# Patient Record
Sex: Male | Born: 1975 | Hispanic: Yes | Marital: Married | Smoking: Never smoker
Health system: Southern US, Community
[De-identification: ages and names within clinical notes are randomized; demographics above are authoritative.]

## PROBLEM LIST (undated history)

## (undated) DIAGNOSIS — M1990 Unspecified osteoarthritis, unspecified site: Secondary | ICD-10-CM

## (undated) HISTORY — PX: NO PAST SURGERIES: SHX2092

## (undated) HISTORY — DX: Unspecified osteoarthritis, unspecified site: M19.90

---

## 2020-01-30 ENCOUNTER — Ambulatory Visit: Payer: Self-pay | Attending: Internal Medicine

## 2020-01-30 DIAGNOSIS — Z23 Encounter for immunization: Secondary | ICD-10-CM

## 2020-01-30 NOTE — Progress Notes (Signed)
   Covid-19 Vaccination Clinic  Name:  Alistar Mcenery    MRN: [id number] DOB: 12-30-1975  01/30/2020  Mr. Tyris Eliot was observed post Covid-19 immunization for 15 minutes without incident. He was provided with Vaccine Information Sheet and instruction to access the V-Safe system.   Mr. Keylin Podolsky was instructed to call 911 with any severe reactions post vaccine: Marland Kitchen Difficulty breathing  . Swelling of face and throat  . A fast heartbeat  . A bad rash all over body  . Dizziness and weakness   Immunizations Administered    Name Date Dose VIS Date Route   Pfizer COVID-19 Vaccine 01/30/2020  3:05 PM 0.3 mL 10/16/2019 Intramuscular   Manufacturer: [employer], [employer]   Lot: [id number]   NDC: [address]-6

## 2020-02-20 ENCOUNTER — Ambulatory Visit: Payer: Self-pay | Attending: Internal Medicine

## 2020-02-20 DIAGNOSIS — Z23 Encounter for immunization: Secondary | ICD-10-CM

## 2020-02-20 NOTE — Progress Notes (Signed)
   Covid-19 Vaccination Clinic  Name:  Edwin Gordon    MRN: [id number] DOB: 10/01/1976  02/20/2020  Mr. Edwin Gordon was observed post Covid-19 immunization for 15 minutes   without incident. He was provided with Vaccine Information Sheet and instruction to access the V-Safe system.   Mr. Edwin Gordon was instructed to call 911 with any severe reactions post vaccine: Marland Kitchen Difficulty breathing  . Swelling of face and throat  . A fast heartbeat  . A bad rash all over body  . Dizziness and weakness   Immunizations Administered    Name Date Dose VIS Date Route   Pfizer COVID-19 Vaccine 02/20/2020  3:12 PM 0.3 mL 10/16/2019 Intramuscular   Manufacturer: [employer], [employer]   Lot: [id number]   NDC: [address]-6

## 2021-11-09 LAB — COMPREHENSIVE METABOLIC PANEL
Albumin: 4.1 (ref 3.5–5.0)
Calcium: 8.7 (ref 8.7–10.7)
Globulin: 2.2

## 2021-11-09 LAB — LIPID PANEL
Cholesterol: 203 — AB (ref 0–200)
HDL: 48 (ref 35–70)
LDL Cholesterol: 35
Triglycerides: 601 — AB (ref 40–160)

## 2021-11-09 LAB — HEPATIC FUNCTION PANEL
Bilirubin, Direct: 0.25 (ref 0.01–0.4)
Bilirubin, Total: 0.7

## 2021-11-09 LAB — BASIC METABOLIC PANEL
BUN: 30 — AB (ref 4–21)
Chloride: 100 (ref 99–108)
Creatinine: 1 (ref 0.6–1.3)
Glucose: 96
Potassium: 3.9 mEq/L (ref 3.5–5.1)
Sodium: 140 (ref 137–147)

## 2021-11-09 LAB — CBC AND DIFFERENTIAL
HCT: 49 (ref 41–53)
Hemoglobin: 16.9 (ref 13.5–17.5)

## 2022-01-23 NOTE — Progress Notes (Signed)
? ?I,Elena D DeSanto,acting as a scribe for Edwin Sprout, FNP.,have documented all relevant documentation on the behalf of Edwin Sprout, FNP,as directed by  Edwin Sprout, FNP while in the presence of Edwin Sprout, FNP. ? ? ?New patient visit ? ? ?Patient: Edwin Gordon   DOB: 11-Jan-1976   46 y.o. Male  MRN: [id number] ?Visit Date: 01/24/2022 ? ?Today's healthcare provider: [name], FNP  ? ?I, Juluis Mire, CMA, have reviewed all documentation for this visit. The documentation on 01/24/22 for the exam, diagnosis, procedures, and orders are all accurate and complete. ? ? ?Subjective  ?  ?Edwin Gordon is a 46 y.o. male who presents today as a new patient to establish care. He presents with [place] interpreter. ? ?HPI  ?Patient has complaint of joint pain, including knees and elbows that has been present for about 1 year that is getting progressively worse.  He states he has been told he has fatty liver, and 'thick blood'. ?He brings with him today labs that were done in [place] on 11/09/21 ? ?Past Medical History:  ?Diagnosis Date  ? Arthritis   ? ?Past Surgical History:  ?Procedure Laterality Date  ? NO PAST SURGERIES    ? ?No family status information on file.  ? ?History reviewed. No pertinent family history. ?Social History  ? ?Socioeconomic History  ? Marital status: Married  ?  Spouse name: Not on file  ? Number of children: Not on file  ? Years of education: Not on file  ? Highest education level: Not on file  ?Occupational History  ? Not on file  ?Tobacco Use  ? Smoking status: Never  ? Smokeless tobacco: Never  ?Substance and Sexual Activity  ? Alcohol use: Yes  ?  Alcohol/week: 6.0 standard drinks  ?  Types: 6 Cans of beer per week  ? Drug use: Never  ? Sexual activity: Not on file  ?Other Topics Concern  ? Not on file  ?Social History Narrative  ? Not on file  ? ?Social Determinants of Health  ? ?Financial Resource Strain: Not on file  ?Food Insecurity: Not on file  ?Transportation  Needs: Not on file  ?Physical Activity: Not on file  ?Stress: Not on file  ?Social Connections: Not on file  ? ?No outpatient medications prior to visit.  ? ?No facility-administered medications prior to visit.  ? ?Not on File ? ?Immunization History  ?Administered Date(s) Administered  ? PFIZER(Purple Top)SARS-COV-2 Vaccination 01/30/2020, 02/20/2020  ? ? ?Health Maintenance  ?Topic Date Due  ? Hepatitis C Screening  Never done  ? TETANUS/TDAP  Never done  ? COVID-19 Vaccine (3 - Booster for Pfizer series) 04/16/2020  ? INFLUENZA VACCINE  02/02/2022 (Originally 06/05/2021)  ? HIV Screening  Completed  ? HPV VACCINES  Aged Out  ? ? ?Patient Care Team: ?Edwin Sprout, FNP as PCP - General (Family Medicine) ? ?Review of Systems  ?Constitutional: Negative.   ?HENT: Negative.    ?Eyes: Negative.   ?Respiratory: Negative.    ?Cardiovascular: Negative.   ?Gastrointestinal: Negative.   ?Endocrine: Negative.   ?Genitourinary:  Positive for flank pain and hematuria.  ?Musculoskeletal:  Positive for arthralgias and back pain.  ?Skin: Negative.   ?Allergic/Immunologic: Negative.   ?Neurological:  Positive for numbness.  ?Hematological: Negative.   ?Psychiatric/Behavioral:  Positive for sleep disturbance (stress related).   ? ? ? Objective  ?  ?BP (!) 131/103 (BP Location: Right Arm, Patient Position: Sitting, Cuff Size:  Normal)   Pulse 86   Temp 98.8 ?F (37.1 ?C) (Oral)   Wt 181 lb (82.1 kg)   SpO2 100%  ?Physical Exam ?Vitals and nursing note reviewed.  ?Constitutional:   ?   Appearance: Normal appearance. He is well-developed, well-groomed and overweight.  ?HENT:  ?   Head: Normocephalic and atraumatic.  ?Eyes:  ?   Pupils: Pupils are equal, round, and reactive to light.  ?Cardiovascular:  ?   Rate and Rhythm: Normal rate and regular rhythm.  ?   Pulses: Normal pulses.  ?   Heart sounds: Normal heart sounds.  ?Pulmonary:  ?   Effort: Pulmonary effort is normal.  ?   Breath sounds: Normal breath sounds.  ?Abdominal:  ?    General: Bowel sounds are normal. There is no distension.  ?   Palpations: Abdomen is soft. There is no mass.  ?   Tenderness: There is no abdominal tenderness. There is no right CVA tenderness, left CVA tenderness, guarding or rebound.  ?   Hernia: No hernia is present.  ?Musculoskeletal:     ?   General: Normal range of motion.  ?   Cervical back: Normal range of motion and neck supple. No rigidity or tenderness.  ?Lymphadenopathy:  ?   Cervical: No cervical adenopathy.  ?Skin: ?   General: Skin is warm and dry.  ?   Capillary Refill: Capillary refill takes less than 2 seconds.  ?Neurological:  ?   General: No focal deficit present.  ?   Mental Status: He is alert and oriented to person, place, and time. Mental status is at baseline.  ?Psychiatric:     ?   Mood and Affect: Mood normal.     ?   Behavior: Behavior normal.     ?   Thought Content: Thought content normal.     ?   Judgment: Judgment normal.  ? ? ?Depression Screen ? ?  01/24/2022  ?  9:37 AM  ?PHQ 2/9 Scores  ?PHQ - 2 Score 0  ?PHQ- 9 Score 0  ? ?Results for orders placed or performed in visit on 01/24/22  ?CBC and differential  ?Result Value Ref Range  ? Hemoglobin 16.9 13.5 - 17.5  ? HCT 49 41 - 53  ?Basic metabolic panel  ?Result Value Ref Range  ? Glucose 96   ? BUN 30 (A) 4 - 21  ? Creatinine 1.0 0.6 - 1.3  ? Potassium 3.9 3.5 - 5.1 mEq/L  ? Sodium 140 137 - 147  ? Chloride 100 99 - 108  ?Comprehensive metabolic panel  ?Result Value Ref Range  ? Globulin 2.2   ? Calcium 8.7 8.7 - 10.7  ? Albumin 4.1 3.5 - 5.0  ?Lipid panel  ?Result Value Ref Range  ? Triglycerides 601 (A) 40 - 160  ? Cholesterol 203 (A) 0 - 200  ? HDL 48 35 - 70  ? LDL Cholesterol 35   ?Hepatic function panel  ?Result Value Ref Range  ? Bilirubin, Direct 0.25 0.01 - 0.4  ? Bilirubin, Total 0.7   ? ? Assessment & Plan   ?  ? ?Problem List Items Addressed This Visit   ? ?  ? Digestive  ? Fatty liver  ?  Chronic, previous stable ?Continues to drink ETOH, sometimes in excess ?Denies  ETOH addiction ?Wife nags on ETOH use d/t fatty liver ?Will repeat LFT and Chem panel ?No hx of liver US ?No abdominal pain  ?Denies change in stool  color, frequency, or consistency  ?  ?  ? Relevant Orders  ? Hepatic function panel  ? Lipid panel  ?  ? Other  ? Arthralgia - Primary  ?  Generalized complaints ?Recommend we check kidney and liver function prior to use of NSAIDs ?Will check for RA as well ?Encourage use of ice, 20 mins on/20 mins off and stretching to assist ?Ensure proper hydration ?  ?  ? Relevant Orders  ? Sed Rate (ESR)  ? C-reactive protein  ? Rheumatoid Factor  ? Chronic bilateral thoracic back pain  ?  Associated with heavy lifting at work, works as an Clinical biochemist  ?Does not take any medication ?Denies imaging hx ?Recommend we check kidney and liver function prior to use of NSAIDs ?  ?  ? Elevated blood pressure reading without diagnosis of hypertension  ?  Patient reports chronic elevation in blood pressure ?Was told it was "stress" related; however, he doesn't feel "stressed" ?Recommend starting medication today with focus on salt intake in diet and 1 month RTC for follow up ?  ?  ? Relevant Medications  ? losartan-hydrochlorothiazide (HYZAAR) 50-12.5 MG tablet  ? Other Relevant Orders  ? Comprehensive metabolic panel  ? CBC with Differential/Platelet  ? Encounter for hepatitis C screening test for low risk patient  ?  Low risk screen ?Treatable, and curable. If left untreated Hep C can lead to cirrhosis and liver failure. ?Encourage routine testing; recommend testing if risk factors change. ? ?  ?  ? Relevant Orders  ? Hepatitis C Antibody  ? History of hematuria  ?  Hx of hematuria with associated flank pain ?Will get UA with micro today to establish baseline ?Low risk for STI given hx of 1 sexual partner ?  ?  ? Relevant Orders  ? Urinalysis, Routine w reflex microscopic  ? Right elbow pain  ?  Chronic, >1 year, worsening ?Relieved with "bone movement" exacerbated with what sounds to be  nerve pain as pt described a "shooting current up arm" ?Hx of elevated uric acid level seen on blood ?Area is not red, inflamed or warm ?Will send for imaging with referral to sports medicine  ?  ?  ? Relevant O

## 2022-01-24 ENCOUNTER — Ambulatory Visit (INDEPENDENT_AMBULATORY_CARE_PROVIDER_SITE_OTHER): Payer: 59 | Admitting: Family Medicine

## 2022-01-24 ENCOUNTER — Ambulatory Visit
Admission: RE | Admit: 2022-01-24 | Discharge: 2022-01-24 | Disposition: A | Discharge status: Home / Self Care | Payer: 59 | Source: Ambulatory Visit | Attending: Family Medicine | Admitting: Family Medicine

## 2022-01-24 ENCOUNTER — Other Ambulatory Visit: Payer: Self-pay

## 2022-01-24 ENCOUNTER — Ambulatory Visit
Admission: RE | Admit: 2022-01-24 | Discharge: 2022-01-24 | Disposition: A | Discharge status: Home / Self Care | Payer: 59 | Attending: Family Medicine | Admitting: Family Medicine

## 2022-01-24 ENCOUNTER — Encounter: Payer: Self-pay | Admitting: Family Medicine

## 2022-01-24 VITALS — BP 131/103 | HR 86 | Temp 98.8°F | Wt 181.0 lb

## 2022-01-24 DIAGNOSIS — Z1329 Encounter for screening for other suspected endocrine disorder: Secondary | ICD-10-CM

## 2022-01-24 DIAGNOSIS — Z5320 Procedure and treatment not carried out because of patient's decision for unspecified reasons: Secondary | ICD-10-CM | POA: Insufficient documentation

## 2022-01-24 DIAGNOSIS — Z87448 Personal history of other diseases of urinary system: Secondary | ICD-10-CM

## 2022-01-24 DIAGNOSIS — M25521 Pain in right elbow: Secondary | ICD-10-CM | POA: Insufficient documentation

## 2022-01-24 DIAGNOSIS — Z131 Encounter for screening for diabetes mellitus: Secondary | ICD-10-CM

## 2022-01-24 DIAGNOSIS — K760 Fatty (change of) liver, not elsewhere classified: Secondary | ICD-10-CM

## 2022-01-24 DIAGNOSIS — Z125 Encounter for screening for malignant neoplasm of prostate: Secondary | ICD-10-CM

## 2022-01-24 DIAGNOSIS — Z1159 Encounter for screening for other viral diseases: Secondary | ICD-10-CM

## 2022-01-24 DIAGNOSIS — M546 Pain in thoracic spine: Secondary | ICD-10-CM

## 2022-01-24 DIAGNOSIS — R030 Elevated blood-pressure reading, without diagnosis of hypertension: Secondary | ICD-10-CM

## 2022-01-24 DIAGNOSIS — G8929 Other chronic pain: Secondary | ICD-10-CM

## 2022-01-24 DIAGNOSIS — M2550 Pain in unspecified joint: Secondary | ICD-10-CM

## 2022-01-24 DIAGNOSIS — Z114 Encounter for screening for human immunodeficiency virus [HIV]: Secondary | ICD-10-CM

## 2022-01-24 MED ORDER — LOSARTAN POTASSIUM-HCTZ 50-12.5 MG PO TABS: 1.0000 | ORAL_TABLET | Freq: Every day | ORAL | 1 refills | Status: DC

## 2022-01-24 NOTE — Assessment & Plan Note (Signed)
No A1c seen on lab work ?Patient is OW ?Will get A1c today given associated elevation in BP and heritage ?Continue to recommend balanced, lower carb meals. Smaller meal size, adding snacks. Choosing water as drink of choice and increasing purposeful exercise. ? ?

## 2022-01-24 NOTE — Assessment & Plan Note (Signed)
Low risk screen ?Consented; encouraged to "know your status" ?Recommend repeat screen if risk factors change ? ?

## 2022-01-24 NOTE — Assessment & Plan Note (Signed)
Associated with heavy lifting at work, works as an Personnel officer  ?Does not take any medication ?Denies imaging hx ?Recommend we check kidney and liver function prior to use of NSAIDs ?

## 2022-01-24 NOTE — Assessment & Plan Note (Signed)
Patient reports chronic elevation in blood pressure ?Was told it was "stress" related; however, he doesn't feel "stressed" ?Recommend starting medication today with focus on salt intake in diet and 1 month RTC for follow up ?

## 2022-01-24 NOTE — Assessment & Plan Note (Signed)
Low risk screen ?Treatable, and curable. If left untreated Hep C can lead to cirrhosis and liver failure. ?Encourage routine testing; recommend testing if risk factors change. ? ?

## 2022-01-24 NOTE — Assessment & Plan Note (Signed)
Chronic, >1 year, worsening ?Relieved with "bone movement" exacerbated with what sounds to be nerve pain as pt described a "shooting current up arm" ?Hx of elevated uric acid level seen on blood ?Area is not red, inflamed or warm ?Will send for imaging with referral to sports medicine  ?

## 2022-01-24 NOTE — Assessment & Plan Note (Signed)
Will get PSA in place of DRE ?Remote hx of hematuria with associated flank pain ?Denies current concerns ?Reports hx of 1 sexual partner, his wife ?

## 2022-01-24 NOTE — Assessment & Plan Note (Signed)
Will get TSH and free T4 today ?Normal thyroid exam ?Associated with elevated BP and OW body habitus ?

## 2022-01-24 NOTE — Assessment & Plan Note (Signed)
Generalized complaints ?Recommend we check kidney and liver function prior to use of NSAIDs ?Will check for RA as well ?Encourage use of ice, 20 mins on/20 mins off and stretching to assist ?Ensure proper hydration ?

## 2022-01-24 NOTE — Assessment & Plan Note (Signed)
Chronic, previous stable ?Continues to drink ETOH, sometimes in excess ?Denies ETOH addiction ?Wife nags on ETOH use d/t fatty liver ?Will repeat LFT and Chem panel ?No hx of liver US ?No abdominal pain  ?Denies change in stool color, frequency, or consistency  ?

## 2022-01-24 NOTE — Assessment & Plan Note (Signed)
Hx of hematuria with associated flank pain ?Will get UA with micro today to establish baseline ?Low risk for STI given hx of 1 sexual partner ?

## 2022-01-25 LAB — CBC WITH DIFFERENTIAL/PLATELET
Basophils Absolute: 0 10*3/uL (ref 0.0–0.2)
Basos: 1 %
EOS (ABSOLUTE): 0.1 10*3/uL (ref 0.0–0.4)
Eos: 1 %
Hematocrit: 49.3 % (ref 37.5–51.0)
Hemoglobin: 16.6 g/dL (ref 13.0–17.7)
Immature Grans (Abs): 0 10*3/uL (ref 0.0–0.1)
Immature Granulocytes: 1 %
Lymphocytes Absolute: 1.5 10*3/uL (ref 0.7–3.1)
Lymphs: 21 %
MCH: 31.2 pg (ref 26.6–33.0)
MCHC: 33.7 g/dL (ref 31.5–35.7)
MCV: 93 fL (ref 79–97)
Monocytes Absolute: 0.4 10*3/uL (ref 0.1–0.9)
Monocytes: 6 %
Neutrophils Absolute: 5.2 10*3/uL (ref 1.4–7.0)
Neutrophils: 70 %
Platelets: 199 10*3/uL (ref 150–450)
RBC: 5.32 x10E6/uL (ref 4.14–5.80)
RDW: 12.6 % (ref 11.6–15.4)
WBC: 7.2 10*3/uL (ref 3.4–10.8)

## 2022-01-25 LAB — LIPID PANEL
Chol/HDL Ratio: 5.5 ratio — ABNORMAL HIGH (ref 0.0–5.0)
Cholesterol, Total: 193 mg/dL (ref 100–199)
HDL: 35 mg/dL — ABNORMAL LOW (ref >39)
LDL Chol Calc (NIH): 96 mg/dL (ref 0–99)
Triglycerides: 368 mg/dL — ABNORMAL HIGH (ref 0–149)
VLDL Cholesterol Cal: 62 mg/dL — ABNORMAL HIGH (ref 5–40)

## 2022-01-25 LAB — URINALYSIS, ROUTINE W REFLEX MICROSCOPIC
Bilirubin, UA: NEGATIVE
Glucose, UA: NEGATIVE
Ketones, UA: NEGATIVE
Leukocytes,UA: NEGATIVE
Nitrite, UA: NEGATIVE
Protein,UA: NEGATIVE
RBC, UA: NEGATIVE
Specific Gravity, UA: 1.016 (ref 1.005–1.030)
Urobilinogen, Ur: 0.2 mg/dL (ref 0.2–1.0)
pH, UA: 8 — ABNORMAL HIGH (ref 5.0–7.5)

## 2022-01-25 LAB — COMPREHENSIVE METABOLIC PANEL
ALT: 60 IU/L — ABNORMAL HIGH (ref 0–44)
AST: 37 IU/L (ref 0–40)
Albumin/Globulin Ratio: 2 (ref 1.2–2.2)
Albumin: 4.9 g/dL (ref 4.0–5.0)
Alkaline Phosphatase: 103 IU/L (ref 44–121)
BUN/Creatinine Ratio: 11 (ref 9–20)
BUN: 9 mg/dL (ref 6–24)
Bilirubin Total: 0.9 mg/dL (ref 0.0–1.2)
CO2: 28 mmol/L (ref 20–29)
Calcium: 9.6 mg/dL (ref 8.7–10.2)
Chloride: 100 mmol/L (ref 96–106)
Creatinine, Ser: 0.79 mg/dL (ref 0.76–1.27)
Globulin, Total: 2.4 g/dL (ref 1.5–4.5)
Glucose: 100 mg/dL — ABNORMAL HIGH (ref 70–99)
Potassium: 4 mmol/L (ref 3.5–5.2)
Sodium: 139 mmol/L (ref 134–144)
Total Protein: 7.3 g/dL (ref 6.0–8.5)
eGFR: 113 mL/min/{1.73_m2} (ref >59)

## 2022-01-25 LAB — HEPATIC FUNCTION PANEL: Bilirubin, Direct: 0.2 mg/dL (ref 0.00–0.40)

## 2022-01-25 LAB — HEMOGLOBIN A1C
Est. average glucose Bld gHb Est-mCnc: 108 mg/dL
Hgb A1c MFr Bld: 5.4 % (ref 4.8–5.6)

## 2022-01-25 LAB — TSH+FREE T4
Free T4: 1.41 ng/dL (ref 0.82–1.77)
TSH: 1.26 u[IU]/mL (ref 0.450–4.500)

## 2022-01-25 LAB — C-REACTIVE PROTEIN: CRP: 1 mg/L (ref 0–10)

## 2022-01-25 LAB — HEPATITIS C ANTIBODY: Hep C Virus Ab: NONREACTIVE

## 2022-01-25 LAB — RHEUMATOID FACTOR: Rheumatoid fact SerPl-aCnc: <10 IU/mL (ref <14.0)

## 2022-01-25 LAB — PSA: Prostate Specific Ag, Serum: 1.2 ng/mL (ref 0.0–4.0)

## 2022-01-25 LAB — SEDIMENTATION RATE: Sed Rate: 2 mm/hr (ref 0–15)

## 2022-01-25 LAB — HIV ANTIBODY (ROUTINE TESTING W REFLEX): HIV Screen 4th Generation wRfx: NONREACTIVE

## 2022-01-25 NOTE — Progress Notes (Signed)
? ? ?  Subjective:   ? ?CC: R elbow pain ? ?I, Christoper Fabian, LAT, ATC, am serving as scribe for Dr. Clementeen Graham. ? ?HPI: Pt is a 47 y/o male c/o R elbow pain x one year. Pt notes bilat elbows bother him, but the R is much works He locates his pain to the medial aspect of the R elbow. 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 ? ?Grip strength: yes- sometimes, w/ increased use ?Radiates: yes ?Numbness/tingling: yes- fingers and forearm ?R elbow swelling: no ?Aggravating factors: pushing off ?Treatments tried: none ? ?Diagnostic testing: R elbow XR- 01/24/22 ? ?Pertinent review of Systems: No fevers or chills ? ?Relevant historical information: Fatty liver disease.  Works in [employer] representative.  Right-hand-dominant. ? ? ?Objective:   ? ?Vitals:  ? 01/26/22 0846  ?BP: 118/86  ?Pulse: 100  ?SpO2: 98%  ? ?General: Well Developed, well nourished, and in no acute distress.  ? ?MSK: Right elbow: Normal-appearing ?Tender palpation at medial epicondyle. ?Normal elbow motion. ?Elbow strength is intact. ?Pain with resisted wrist flexion, hand grip, and wrist supination. ?No pain with wrist extension. ? ?Left elbow: Normal. ?Mildly tender palpation of medial epicondyle. ?Normal elbow motion. ?Elbow strength is intact. ?Mild pain with resisted wrist flexion. ? ?Pulses cap refill and sensation are intact distally bilateral upper extremities. ? ?Lab and Radiology Results ? ?Procedure: Real-time Ultrasound Guided Injection of right elbow medial epicondyle common flexor tendon origin ?Device: Philips Affiniti 50G ?Images permanently stored and available for review in PACS ?Verbal informed consent obtained.  Discussed risks and benefits of procedure. Warned about infection bleeding damage to structures skin hypopigmentation and fat atrophy among others. ?Patient expresses understanding and agreement ?Time-out conducted.   ?Noted no overlying erythema, induration, or other signs of local infection.   ?Skin prepped in a sterile  fashion.   ?Local anesthesia: Topical Ethyl chloride.   ?With sterile technique and under real time ultrasound guidance: 40 mg of Kenalog and 1 mL of lidocaine injected into the common flexor tendon origin. Fluid seen entering the epicondyle.   ?Completed without difficulty   ?Pain immediately resolved suggesting accurate placement of the medication.   ?Advised to call if fevers/chills, erythema, induration, drainage, or persistent bleeding.   ?Images permanently stored and available for review in the ultrasound unit.  ?Impression: Technically successful ultrasound guided injection. ? ? ? ? ? ? ?Impression and Recommendations:   ? ?Assessment and Plan: ?46 y.o. 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  His right elbow pain is quite bothersome and interferes with his ability to work in [employer] representative.  Plan for injection today and home exercise program taught in clinic today for bilateral elbows by ATC.  Also recommend Voltaren gel.  Recheck in 6 weeks.. ? ?PDMP not reviewed this encounter. ?Orders Placed This Encounter  ?Procedures  ? [place] LIMITED JOINT SPACE STRUCTURES UP RIGHT(NO LINKED CHARGES)  ?  Order Specific Question:   Reason for Exam (SYMPTOM  OR DIAGNOSIS REQUIRED)  ?  Answer:   right elbow pain  ?  Order Specific Question:   Preferred imaging location?  ?  Answer:   [facility]  ? ?No orders of the defined types were placed in this encounter. ? ? ?Discussed warning signs or symptoms. Please see discharge instructions. Patient expresses understanding. ? ? ?The above documentation has been reviewed and is accurate and complete Clementeen Graham, M.D. ? ?Spanish interpreter used for today's visit. ?

## 2022-01-26 ENCOUNTER — Ambulatory Visit: Payer: Self-pay

## 2022-01-26 ENCOUNTER — Other Ambulatory Visit: Payer: Self-pay | Admitting: Family Medicine

## 2022-01-26 ENCOUNTER — Ambulatory Visit: Payer: 59 | Admitting: Family Medicine

## 2022-01-26 ENCOUNTER — Other Ambulatory Visit: Payer: Self-pay

## 2022-01-26 ENCOUNTER — Telehealth: Payer: Self-pay

## 2022-01-26 VITALS — BP 118/86 | HR 100 | Wt 180.2 lb

## 2022-01-26 DIAGNOSIS — M7702 Medial epicondylitis, left elbow: Secondary | ICD-10-CM | POA: Diagnosis not present

## 2022-01-26 DIAGNOSIS — M7701 Medial epicondylitis, right elbow: Secondary | ICD-10-CM

## 2022-01-26 DIAGNOSIS — M25521 Pain in right elbow: Secondary | ICD-10-CM | POA: Diagnosis not present

## 2022-01-26 NOTE — Patient Instructions (Addendum)
Thank you for coming in today.  ? ?You received an injection today. Seek immediate medical attention if the joint becomes red, extremely painful, or is oozing fluid.  ? ?Please complete the exercises that the athletic trainer went over with you:  View at [email or web address]-exercise-[email or web address] using code: [email or web address] ? ?Please use Voltaren gel (Generic Diclofenac Gel) up to 4x daily for pain as needed.  This is available over-the-counter as both the name brand Voltaren gel and the generic diclofenac gel.  ? ?Recheck back in 6 weeks ?

## 2022-01-26 NOTE — Telephone Encounter (Signed)
Using [employer] [phone]. Pt given lab results per notes of Merita Norton, FNP on 01/25/22 and 01/26/22. Pt verbalized understanding. He says he will buy the Omega 3 OTC. He says he was told before that he has a fatty liver and his blood was thick, he wants to know if this will cause his liver enzymes to be elevated and says he doesn't drink alcohol excessive. Advised I will send this to the provider and someone will call back with her recommendation. ? ? ?Jacky Kindle, FNP  ?01/26/2022  8:07 AM EDT   ?  ?All joint labs, including uric acid are negative. This is reassuring.  ? Jacky Kindle, FNP  ?01/25/2022  9:20 AM EDT   ?  ?FYI- patient speaks Spanish. ?  ?Blood chemistry shows elevated liver enzyme, ALT. This is likely elevated from excess alcohol use.  ?  ?Cholesterol total is stable. ?  ?Triglycerides are much lower, at 368. This is still very high. I would recommend medication to assist- Omega 3 fish oil, I can Rx or this can be OTC. ?  ?HDL, good cholesterol is low. Recommend increase in diet of healthier fat choices- low fat meats, oils that are not solid at room temperature, nuts, seeds, fish- cod, halibut, salmon, and avocado. Exercise can also increase this number.  Supplemental omega 3's can be taken as well but are not as helpful as dietary/exercise changes. ?  ?Risk of heart attack/stroke is low.  ?  ?The 10-year ASCVD risk score (Arnett DK, et al., 2019) is: 2.5% ?  Values used to calculate the score: ?    Age: 46 years ?    Sex: Male ?    Is Non-Hispanic African American: No ?    Diabetic: No ?    Tobacco smoker: No ?    Systolic Blood Pressure: 131 mmHg ?    Is BP treated: No ?    HDL Cholesterol: 35 mg/dL ?    Total Cholesterol: 193 mg/dL ?  ?Urine normal. ?  ?Cell counts normal. ?  ?Thyroid normal. ?  ?PSA normal. ?  ?Liver normal. ?  ?Non reactive/negative Hep C/HIV. ?  ?No pre-diabetes. ?  ?I will follow back on labs for inflammation for joint pain. ?   ? ?

## 2022-01-29 NOTE — Progress Notes (Signed)
Patient's wife advised as below. 

## 2022-01-29 NOTE — Telephone Encounter (Signed)
Lmtcb okay for [facility] nurse triage to advise. KW 

## 2022-02-01 ENCOUNTER — Other Ambulatory Visit: Payer: Self-pay | Admitting: Family Medicine

## 2022-02-01 DIAGNOSIS — K760 Fatty (change of) liver, not elsewhere classified: Secondary | ICD-10-CM

## 2022-02-01 DIAGNOSIS — R7401 Elevation of levels of liver transaminase levels: Secondary | ICD-10-CM | POA: Insufficient documentation

## 2022-02-01 NOTE — Telephone Encounter (Signed)
Left detailed message advising as per Robynn Pane. [facility] please advise if patient calls back.  ?

## 2022-02-22 NOTE — Progress Notes (Signed)
?  ? ?I,Sulibeya S Dimas,acting as a scribe for [name], FNP.,have documented all relevant documentation on the behalf of [name], FNP,as directed by  [name], FNP while in the presence of [name], FNP. ? ? ?Established patient visit ? ? ?Patient: Edwin Gordon   DOB: Apr 20, 1976   46 y.o. Male  MRN: [id number] ?Visit Date: 02/23/2022 ? ?Today's healthcare provider: [name], FNP  ?Re Introduced to nurse practitioner role and practice setting.  All questions answered.  Discussed provider/patient relationship and expectations. ? ?Interpretor: Eddie present for entirety of exam ? ?Chief Complaint  ?Patient presents with  ? Follow-up  ? ?Subjective  ?  ?HPI  ?Follow up for elevated blood pressure ? ?The patient was last seen for this 1 months ago. ?Changes made at last visit include start losartan-HCTZ 50-12.35m tablet daily, focus on salt intake. ? ?He reports excellent compliance with treatment. ?He feels that condition is Improved. ?He is not having side effects.  ? ?BP Readings from Last 3 Encounters:  ?02/23/22 120/80  ?01/26/22 118/86  ?01/24/22 (!) 131/103  ? ? ? ?----------------------------------------------------------------------------------------- ? ? ?Medications: ?Outpatient Medications Prior to Visit  ?Medication Sig  ? DHA-EPA-Vitamin E (OMEGA-3 COMPLEX PO) Take by mouth.  ? losartan-hydrochlorothiazide (HYZAAR) 50-12.5 MG tablet Take 1 tablet by mouth daily.  ? ?No facility-administered medications prior to visit.  ? ? ?Review of Systems  ?Constitutional:  Negative for appetite change and fatigue.  ?Respiratory:  Negative for chest tightness and shortness of breath.   ?Cardiovascular:  Negative for chest pain, palpitations and leg swelling.  ? ?Last CBC ?Lab Results  ?Component Value Date  ? WBC 7.2 01/24/2022  ? HGB 16.6 01/24/2022  ? HCT 49.3 01/24/2022  ? MCV 93 01/24/2022  ? MCH 31.2 01/24/2022  ? RDW 12.6 01/24/2022  ? PLT 199 01/24/2022  ? ?Last metabolic  panel ?Lab Results  ?Component Value Date  ? GLUCOSE 100 (H) 01/24/2022  ? NA 139 01/24/2022  ? K 4.0 01/24/2022  ? CL 100 01/24/2022  ? CO2 28 01/24/2022  ? BUN 9 01/24/2022  ? CREATININE 0.79 01/24/2022  ? EGFR 113 01/24/2022  ? CALCIUM 9.6 01/24/2022  ? PROT 7.3 01/24/2022  ? ALBUMIN 4.9 01/24/2022  ? LABGLOB 2.4 01/24/2022  ? AGRATIO 2.0 01/24/2022  ? BILITOT 0.9 01/24/2022  ? ALKPHOS 103 01/24/2022  ? AST 37 01/24/2022  ? ALT 60 (H) 01/24/2022  ? ?Last lipids ?Lab Results  ?Component Value Date  ? CHOL 193 01/24/2022  ? HDL 35 (L) 01/24/2022  ? [place]96 01/24/2022  ? TRIG 368 (H) 01/24/2022  ? CHOLHDL 5.5 (H) 01/24/2022  ? ?Last hemoglobin A1c ?Lab Results  ?Component Value Date  ? HGBA1C 5.4 01/24/2022  ? ?  ?  Objective  ?  ?BP 120/80 (BP Location: Right Arm, Patient Position: Sitting, Cuff Size: Large)   Pulse 81   Temp 98.5 ?F (36.9 ?C) (Oral)   Resp 16   Ht 5' 8"  (1.727 m)   Wt 177 lb 8 oz (80.5 kg)   SpO2 99%   BMI 26.99 kg/m?  ?BP Readings from Last 3 Encounters:  ?02/23/22 120/80  ?01/26/22 118/86  ?01/24/22 (!) 131/103  ? ?Wt Readings from Last 3 Encounters:  ?02/23/22 177 lb 8 oz (80.5 kg)  ?01/26/22 180 lb 3.2 oz (81.7 kg)  ?01/24/22 181 lb (82.1 kg)  ? ?  ? ?Physical Exam ?Vitals and nursing note reviewed.  ?Constitutional:   ?  Appearance: Normal appearance. He is overweight.  ?HENT:  ?   Head: Normocephalic and atraumatic.  ?Eyes:  ?   Pupils: Pupils are equal, round, and reactive to light.  ?Cardiovascular:  ?   Rate and Rhythm: Normal rate and regular rhythm.  ?   Pulses: Normal pulses.  ?   Heart sounds: Normal heart sounds.  ?Pulmonary:  ?   Effort: Pulmonary effort is normal.  ?   Breath sounds: Normal breath sounds.  ?Musculoskeletal:     ?   General: Tenderness present. No swelling. Normal range of motion.  ?   Cervical back: Normal range of motion.  ?   Right lower leg: No edema.  ?   Left lower leg: No edema.  ?   Comments: Bilateral tenderness on shins s/p use of ladder at  work. Encouraged use of voltaren, taller/thicker socks or use of a ladder pad.  ?Skin: ?   General: Skin is warm and dry.  ?   Capillary Refill: Capillary refill takes less than 2 seconds.  ?Neurological:  ?   General: No focal deficit present.  ?   Mental Status: He is alert and oriented to person, place, and time. Mental status is at baseline.  ?Psychiatric:     ?   Mood and Affect: Mood normal.     ?   Behavior: Behavior normal.     ?   Thought Content: Thought content normal.     ?   Judgment: Judgment normal.  ?  ? ?No results found for any visits on 02/23/22. ? Assessment & Plan  ?  ? ?Problem List Items Addressed This Visit   ? ?  ? Cardiovascular and Mediastinum  ? Primary hypertension - Primary  ?  Chronic, stable; improvement seen since starting medication ?Denies CP ?Denies SOB/ DOE ?Denies low blood pressure/hypotension ?Denies vision changes ?No LE Edema noted on exam ?Continue medication, Hyzaar 50-12.5 mg QD ?Denies side effects ?RTC 4 months ?Seek emergent care if you develop chest pain or chest pressure ? ?  ?  ?  ? Digestive  ? NAFLD (nonalcoholic fatty liver disease)  ?  Previous liver enzymes elevated; is working on diet and exercise. ?Will seek out time of liver US with referral for previous order placed ?Continue to limit ETOH consumption as previously recommended ? ?  ?  ?  ? Other  ? Arthralgia  ?  Acute on chronic ?Was seen for Elbow pain at Sports medicine; has improved s/p injection; has not started Voltaren ?Complaint of shin pain, s/p leaning on ladder- site appears to be bruised; however, pt notes that it is a chronic discoloration from site where ladder hits his shins ?Recommend use of voltaren on BLE; taller socks/thicker socks; or use of ladder pain. ? ?  ?  ? Elevated cholesterol with elevated triglycerides  ?  Chronic, previously elevated ?Low ASCVD ?Doing well with 2 tablets of Omega3 purchased OTC ?Continue medication with lifestyle changes; recommend FLP in 4 months ?We  recommend diet low in saturated fat and regular exercise - 30 min at least 5 times per week ? ? ?  ?  ? ? ? ?Return in about 4 months (around 06/25/2022) for chonic disease management.  ?   ? ?I, [name], FNP, have reviewed all documentation for this visit. The documentation on 02/23/22 for the exam, diagnosis, procedures, and orders are all accurate and complete. ? ?[name], FNP  ?[place] ?[phone] (phone) ?[phone] (fax) ? ?  [facility]  ?

## 2022-02-23 ENCOUNTER — Encounter: Payer: Self-pay | Admitting: Family Medicine

## 2022-02-23 ENCOUNTER — Ambulatory Visit: Payer: 59 | Admitting: Family Medicine

## 2022-02-23 VITALS — BP 120/80 | HR 81 | Temp 98.5°F | Resp 16 | Ht 68.0 in | Wt 177.5 lb

## 2022-02-23 DIAGNOSIS — M2550 Pain in unspecified joint: Secondary | ICD-10-CM

## 2022-02-23 DIAGNOSIS — I10 Essential (primary) hypertension: Secondary | ICD-10-CM

## 2022-02-23 DIAGNOSIS — E782 Mixed hyperlipidemia: Secondary | ICD-10-CM

## 2022-02-23 DIAGNOSIS — K760 Fatty (change of) liver, not elsewhere classified: Secondary | ICD-10-CM | POA: Insufficient documentation

## 2022-02-23 NOTE — Assessment & Plan Note (Signed)
Chronic, stable; improvement seen since starting medication ?Denies CP ?Denies SOB/ DOE ?Denies low blood pressure/hypotension ?Denies vision changes ?No LE Edema noted on exam ?Continue medication, Hyzaar 50-12.5 mg QD ?Denies side effects ?RTC 4 months ?Seek emergent care if you develop chest pain or chest pressure ? ?

## 2022-02-23 NOTE — Assessment & Plan Note (Signed)
Chronic, previously elevated ?Low ASCVD ?Doing well with 2 tablets of Omega3 purchased OTC ?Continue medication with lifestyle changes; recommend FLP in 4 months ?We recommend diet low in saturated fat and regular exercise - 30 min at least 5 times per week ? ?

## 2022-02-23 NOTE — Assessment & Plan Note (Signed)
Previous liver enzymes elevated; is working on diet and exercise. ?Will seek out time of liver US with referral for previous order placed ?Continue to limit ETOH consumption as previously recommended ?

## 2022-02-23 NOTE — Assessment & Plan Note (Signed)
Acute on chronic ?Was seen for Elbow pain at Sports medicine; has improved s/p injection; has not started Voltaren ?Complaint of shin pain, s/p leaning on ladder- site appears to be bruised; however, pt notes that it is a chronic discoloration from site where ladder hits his shins ?Recommend use of voltaren on BLE; taller socks/thicker socks; or use of ladder pain. ?

## 2022-03-02 ENCOUNTER — Other Ambulatory Visit: Payer: Self-pay | Admitting: Family Medicine

## 2022-03-02 ENCOUNTER — Ambulatory Visit
Admission: RE | Admit: 2022-03-02 | Discharge: 2022-03-02 | Disposition: A | Discharge status: Home / Self Care | Payer: 59 | Source: Ambulatory Visit | Attending: Family Medicine | Admitting: Family Medicine

## 2022-03-02 DIAGNOSIS — R7401 Elevation of levels of liver transaminase levels: Secondary | ICD-10-CM | POA: Diagnosis present

## 2022-03-02 DIAGNOSIS — K760 Fatty (change of) liver, not elsewhere classified: Secondary | ICD-10-CM

## 2022-03-16 ENCOUNTER — Ambulatory Visit: Payer: 59 | Admitting: Family Medicine

## 2022-04-10 ENCOUNTER — Ambulatory Visit: Payer: Self-pay | Admitting: Internal Medicine

## 2022-04-13 LAB — SPECIMEN STATUS REPORT

## 2022-04-13 LAB — URIC ACID: Uric Acid: 5.8 mg/dL (ref 3.8–8.4)

## 2022-05-17 ENCOUNTER — Other Ambulatory Visit: Payer: Self-pay | Admitting: Family Medicine

## 2022-05-17 DIAGNOSIS — M2550 Pain in unspecified joint: Secondary | ICD-10-CM

## 2022-06-06 ENCOUNTER — Other Ambulatory Visit: Payer: Self-pay | Admitting: Family Medicine

## 2022-06-20 NOTE — Progress Notes (Signed)
Established patient visit   Patient: Edwin Gordon   DOB: 1976-06-23   46 y.o. Male  MRN: [id number] Visit Date: 06/22/2022  Today's healthcare provider: [name], FNP  Introduced to nurse practitioner role and practice setting.  All questions answered.  Discussed provider/patient relationship and expectations.  I,Tiffany J Bragg,acting as a scribe for [name], FNP.,have documented all relevant documentation on the behalf of [name], FNP,as directed by  [name], FNP while in the presence of [name], FNP.  Chief Complaint  Patient presents with   Hypertension   Hyperlipidemia   Subjective    HPI  Hypertension, follow-up  BP Readings from Last 3 Encounters:  06/22/22 (!) 134/97  02/23/22 120/80  01/26/22 118/86   Wt Readings from Last 3 Encounters:  06/22/22 184 lb (83.5 kg)  02/23/22 177 lb 8 oz (80.5 kg)  01/26/22 180 lb 3.2 oz (81.7 kg)     He was last seen for hypertension 4 months ago.  BP at that visit was 120/80. Management since that visit includes continue hyzaar.  He reports excellent compliance with treatment. He is not having side effects.  He is following a Regular diet. He is not exercising. He does not smoke.  Use of agents associated with hypertension: none.   Outside blood pressures are not checked regularly. Symptoms: No chest pain Yes chest pressure  No palpitations No syncope  No dyspnea No orthopnea  No paroxysmal nocturnal dyspnea No lower extremity edema   Pertinent labs Lab Results  Component Value Date   CHOL 193 01/24/2022   HDL 35 (L) 01/24/2022   LDLCALC 96 01/24/2022   TRIG 368 (H) 01/24/2022   CHOLHDL 5.5 (H) 01/24/2022   Lab Results  Component Value Date   NA 139 01/24/2022   K 4.0 01/24/2022   CREATININE 0.79 01/24/2022   EGFR 113 01/24/2022   GLUCOSE 100 (H) 01/24/2022   TSH 1.260 01/24/2022     The 10-year ASCVD risk score (Arnett DK, et al., 2019) is: 3.1%  Lipid/Cholesterol,  Follow-up  Last lipid panel Other pertinent labs  Lab Results  Component Value Date   CHOL 193 01/24/2022   HDL 35 (L) 01/24/2022   LDLCALC 96 01/24/2022   TRIG 368 (H) 01/24/2022   CHOLHDL 5.5 (H) 01/24/2022   Lab Results  Component Value Date   ALT 60 (H) 01/24/2022   AST 37 01/24/2022   PLT 199 01/24/2022   TSH 1.260 01/24/2022     He was last seen for this 4 months ago.  Management since that visit includes continue OTC omega supplement.  He reports excellent compliance with treatment. He is not having side effects.   Symptoms: No chest pain No chest pressure/discomfort  No dyspnea No lower extremity edema  Yes numbness or tingling of extremity No orthopnea  No palpitations No paroxysmal nocturnal dyspnea  No speech difficulty No syncope   Current diet: in general, a "healthy" diet   Current exercise: none  The 10-year ASCVD risk score (Arnett DK, et al., 2019) is: 3.1%  ---------------------------------------------------------------------------------------------------  ---------------------------------------------------------------------------------------------------   Medications: Outpatient Medications Prior to Visit  Medication Sig   DHA-EPA-Vitamin E (OMEGA-3 COMPLEX PO) Take by mouth.   [DISCONTINUED] losartan-hydrochlorothiazide (HYZAAR) 50-12.5 MG tablet Take 1 tablet by mouth daily.   No facility-administered medications prior to visit.   Review of Systems    Objective    BP (!) 134/97 (BP Location: Right Arm, Patient Position: Sitting, Cuff Size:  Normal)   Pulse 83   Temp 98.2 F (36.8 C) (Core)   Resp 16   Ht 5' 6"  (1.676 m)   Wt 184 lb (83.5 kg)   SpO2 100%   BMI 29.70 kg/m    Physical Exam Vitals and nursing note reviewed.  Constitutional:      Appearance: Normal appearance. He is overweight.  HENT:     Head: Normocephalic and atraumatic.  Eyes:     Pupils: Pupils are equal, round, and reactive to light.  Cardiovascular:      Rate and Rhythm: Normal rate and regular rhythm.     Pulses: Normal pulses.     Heart sounds: Normal heart sounds.  Pulmonary:     Effort: Pulmonary effort is normal.     Breath sounds: Normal breath sounds.  Musculoskeletal:        General: Normal range of motion.     Cervical back: Normal range of motion.  Skin:    General: Skin is warm and dry.     Capillary Refill: Capillary refill takes less than 2 seconds.  Neurological:     General: No focal deficit present.     Mental Status: He is alert and oriented to person, place, and time. Mental status is at baseline.  Psychiatric:        Mood and Affect: Mood normal.        Behavior: Behavior normal.        Thought Content: Thought content normal.        Judgment: Judgment normal.     No results found for any visits on 06/22/22.  Assessment & Plan     Problem List Items Addressed This Visit       Cardiovascular and Mediastinum   Primary hypertension - Primary    Chronic, previously stable; goal <140/<90 Did not take medication today as he came fasting; advised that able to use medications prior to appts with water for future reference Continue Hyzaar 50-12.5      Relevant Medications   losartan-hydrochlorothiazide (HYZAAR) 50-12.5 MG tablet   Other Relevant Orders   Comprehensive Metabolic Panel (CMET)     Other   Elevated cholesterol with elevated triglycerides    Chronic, previous unstable Has started on fish oils Repeat FLP I recommend diet low in saturated fat and regular exercise - 30 min at least 5 times per week       Relevant Medications   losartan-hydrochlorothiazide (HYZAAR) 50-12.5 MG tablet   Other Relevant Orders   Lipid panel   Return in about 6 months (around 12/23/2022) for annual examination.     Vonna Kotyk, FNP, have reviewed all documentation for this visit. The documentation on 06/22/22 for the exam, diagnosis, procedures, and orders are all accurate and complete.  [name], [place] [phone] (phone) [phone] (fax)  [place]

## 2022-06-22 ENCOUNTER — Encounter: Payer: Self-pay | Admitting: Family Medicine

## 2022-06-22 ENCOUNTER — Ambulatory Visit (INDEPENDENT_AMBULATORY_CARE_PROVIDER_SITE_OTHER): Payer: 59 | Admitting: Family Medicine

## 2022-06-22 VITALS — BP 134/97 | HR 83 | Temp 98.2°F | Resp 16 | Ht 66.0 in | Wt 184.0 lb

## 2022-06-22 DIAGNOSIS — E782 Mixed hyperlipidemia: Secondary | ICD-10-CM | POA: Diagnosis not present

## 2022-06-22 DIAGNOSIS — I10 Essential (primary) hypertension: Secondary | ICD-10-CM | POA: Diagnosis not present

## 2022-06-22 MED ORDER — LOSARTAN POTASSIUM-HCTZ 50-12.5 MG PO TABS: 1.0000 | ORAL_TABLET | Freq: Every day | ORAL | 1 refills | Status: DC

## 2022-06-22 NOTE — Assessment & Plan Note (Signed)
Chronic, previous unstable Has started on fish oils Repeat FLP I recommend diet low in saturated fat and regular exercise - 30 min at least 5 times per week

## 2022-06-22 NOTE — Assessment & Plan Note (Addendum)
Chronic, previously stable; goal <140/<90 Did not take medication today as he came fasting; advised that able to use medications prior to appts with water for future reference Continue Hyzaar 50-12.5

## 2022-06-23 LAB — COMPREHENSIVE METABOLIC PANEL
ALT: 78 IU/L — ABNORMAL HIGH (ref 0–44)
AST: 40 IU/L (ref 0–40)
Albumin/Globulin Ratio: 2 (ref 1.2–2.2)
Albumin: 4.9 g/dL (ref 4.1–5.1)
Alkaline Phosphatase: 93 IU/L (ref 44–121)
BUN/Creatinine Ratio: 11 (ref 9–20)
BUN: 9 mg/dL (ref 6–24)
Bilirubin Total: 0.8 mg/dL (ref 0.0–1.2)
CO2: 21 mmol/L (ref 20–29)
Calcium: 9.3 mg/dL (ref 8.7–10.2)
Chloride: 102 mmol/L (ref 96–106)
Creatinine, Ser: 0.82 mg/dL (ref 0.76–1.27)
Globulin, Total: 2.4 g/dL (ref 1.5–4.5)
Glucose: 111 mg/dL — ABNORMAL HIGH (ref 70–99)
Potassium: 4.5 mmol/L (ref 3.5–5.2)
Sodium: 141 mmol/L (ref 134–144)
Total Protein: 7.3 g/dL (ref 6.0–8.5)
eGFR: 112 mL/min/{1.73_m2} (ref >59)

## 2022-06-23 LAB — LIPID PANEL
Chol/HDL Ratio: 5.9 ratio — ABNORMAL HIGH (ref 0.0–5.0)
Cholesterol, Total: 208 mg/dL — ABNORMAL HIGH (ref 100–199)
HDL: 35 mg/dL — ABNORMAL LOW (ref >39)
LDL Chol Calc (NIH): 128 mg/dL — ABNORMAL HIGH (ref 0–99)
Triglycerides: 253 mg/dL — ABNORMAL HIGH (ref 0–149)
VLDL Cholesterol Cal: 45 mg/dL — ABNORMAL HIGH (ref 5–40)

## 2022-06-25 ENCOUNTER — Other Ambulatory Visit: Payer: Self-pay | Admitting: Family Medicine

## 2022-06-25 NOTE — Progress Notes (Signed)
Cholesterol is slightly increased as is bad/LDL cholesterol. HDL/good cholesterol remains stable. Fats/triglycerides have improved.  Stable elevation in glucose and liver enzyme/ALT.   Jacky Kindle, FNP  [facility] [address] [place], [place] [address] [phone] (phone) [phone] (fax) [facility]

## 2022-06-29 ENCOUNTER — Ambulatory Visit: Payer: Self-pay | Admitting: *Deleted

## 2022-06-29 NOTE — Telephone Encounter (Signed)
Summary: sore arm   Pt states he had labs done 2 wks ago and feels his tendon or nerve was damaged during the process   Pt states his arm is sore and feels "weird"   Please fu w/ pt   Pt requires a spanish interpreter      Called patient via interpreter Jerene Dilling [phone] to review arm pain. No answer, LVMTCB

## 2022-06-29 NOTE — Telephone Encounter (Signed)
  Chief Complaint: Pain when lifting with right arm Symptoms: Pain when lifting heavy things with right arm Frequency: since blood draw 2 weeks ago Pertinent Negatives: Patient denies redness Disposition: [] ED /[] Urgent Care (no appt availability in office) / [x] Appointment(In office/virtual)/ []  [place] [facility]/ [] Home Care/ [] Refused Recommended Disposition /[] [place] [facility]/ []  Follow-up with PCP Additional Notes: Pt had blood drawn 2 weeks ago and states that during blood draw the needle went into his tendon or something. He states that since then his arm feels strange and it is painful when he lifts heavy thing with his right arm.     Summary: sore arm   Pt states he had labs done 2 wks ago and feels his tendon or nerve was damaged during the process   Pt states his arm is sore and feels "weird"   Please fu w/ pt   Pt requires a spanish interpreter      Reason for Disposition  [1] After 2 weeks AND [2] still painful  Answer Assessment - Initial Assessment Questions 1. MECHANISM: "How did the injury happen?"     Blood Draw 2. ONSET: "When did the injury happen?" (Minutes or hours ago)      2 weeks ago 3. LOCATION: "Where is the injury located?" "Which arm?"     Right arm 4. APPEARANCE of INJURY: "What does the injury look like?"      normal 5. SEVERITY: "Can you use the arm normally?"      No - cannot lift heavy things. Feels pulling in his forearm 6. SWELLING or BRUISING: "is there any swelling or bruising?" If Yes, ask: "How large is it? (e.g., inches, centimeters)      no 7. PAIN: "Is there pain?" If Yes, ask: "How bad is the pain?"    (Scale 1-10; or mild, moderate, severe)   - NONE (0): No pain.   - MILD (1-3): Doesn't interfere with normal activities.   - MODERATE (4-7): Interferes with normal activities (e.g., work or school) or awakens from sleep.   - SEVERE (8-10): Excruciating pain, unable to do any normal activities, unable to hold a cup of  water.     8-9/10.   8. TETANUS: For any breaks in the skin, ask: "When was the last tetanus booster?"     na 9. OTHER SYMPTOMS: "Do you have any other symptoms?"  (e.g., numbness in hand)     no 10. PREGNANCY: "Is there any chance you are pregnant?" "When was your last menstrual period?"       na  Protocols used: Arm Injury-A-AH

## 2022-06-29 NOTE — Telephone Encounter (Signed)
Please review and advise.

## 2022-07-02 ENCOUNTER — Encounter: Payer: Self-pay | Admitting: Family Medicine

## 2022-07-02 ENCOUNTER — Ambulatory Visit (INDEPENDENT_AMBULATORY_CARE_PROVIDER_SITE_OTHER): Payer: 59 | Admitting: Family Medicine

## 2022-07-02 VITALS — BP 119/80 | HR 100 | Temp 98.7°F | Resp 14 | Wt 185.0 lb

## 2022-07-02 DIAGNOSIS — M7701 Medial epicondylitis, right elbow: Secondary | ICD-10-CM | POA: Diagnosis not present

## 2022-07-02 MED ORDER — NAPROXEN 500 MG PO TABS: 500.0000 mg | ORAL_TABLET | Freq: Two times a day (BID) | ORAL | 0 refills | Status: DC

## 2022-07-02 NOTE — Progress Notes (Signed)
     I,Roshena L Chambers,acting as a scribe for Mila Merry, MD.,have documented all relevant documentation on the behalf of Mila Merry, MD,as directed by  Mila Merry, MD while in the presence of Mila Merry, MD.   Established patient visit   Patient: Edwin Gordon   DOB: 1976/02/01   46 y.o. Male  MRN: [id number] Visit Date: 07/02/2022  Today's healthcare provider: Mila Merry, MD   Chief Complaint  Patient presents with   Arm Pain   Subjective    Arm Pain  Incident onset: 2 weeks ago after having blood drawn. Pain location: right arm. The quality of the pain is described as burning (also a pulling pain in the veins of his right forearm). The symptoms are aggravated by lifting (lifting a cup of water).  He does have history of medial epicondylitis receiving steroid injection by Dr. Denyse Amass in March. He is having some pain around the medial epicondyle, but he states he is also having sensation of "pinch" along of right antecubital fossae ever since having blood drawn 10 days ago. He has been applying ice and some type of ointment which has not been helpful.   Medications: Outpatient Medications Prior to Visit  Medication Sig   DHA-EPA-Vitamin E (OMEGA-3 COMPLEX PO) Take by mouth.   losartan-hydrochlorothiazide (HYZAAR) 50-12.5 MG tablet Take 1 tablet by mouth daily.   No facility-administered medications prior to visit.       Objective    BP 119/80 (BP Location: Left Arm, Patient Position: Sitting, Cuff Size: Large)   Pulse 100   Temp 98.7 F (37.1 C) (Oral)   Resp 14   Wt 185 lb (83.9 kg)   BMI 29.86 kg/m   Physical Exam   Mild tenderness of right medial epicondyle, no other tenderness redness or gross deformities. Normal strength of right hand and elbow.   Assessment & Plan     1. Medial epicondylitis of right elbow Naprosyn 500mg  twice a day as needed. Expect sensation of 'pinch' around site of blood draw to resolve after a 1-2 more weeks.    Spanish interpreter service were available by telephone throughout duration of today's visit.      The entirety of the information documented in the History of Present Illness, Review of Systems and Physical Exam were personally obtained by me. Portions of this information were initially documented by the CMA and reviewed by me for thoroughness and accuracy.     , MD  [facility] [phone] (phone) [phone] (fax)  [facility]

## 2022-07-14 ENCOUNTER — Other Ambulatory Visit: Payer: Self-pay | Admitting: Family Medicine

## 2022-07-14 DIAGNOSIS — M7701 Medial epicondylitis, right elbow: Secondary | ICD-10-CM

## 2022-08-01 ENCOUNTER — Ambulatory Visit: Payer: 59 | Admitting: Family Medicine

## 2022-11-30 ENCOUNTER — Telehealth: Payer: Self-pay | Admitting: Family Medicine

## 2022-12-24 ENCOUNTER — Ambulatory Visit: Payer: 59 | Admitting: Family Medicine

## 2022-12-25 ENCOUNTER — Ambulatory Visit: Payer: 59 | Admitting: Family Medicine

## 2023-01-06 ENCOUNTER — Other Ambulatory Visit: Payer: Self-pay | Admitting: Family Medicine

## 2023-01-06 DIAGNOSIS — I10 Essential (primary) hypertension: Secondary | ICD-10-CM

## 2023-01-07 NOTE — Telephone Encounter (Signed)
Requested Prescriptions  Pending Prescriptions Disp Refills   losartan-hydrochlorothiazide (HYZAAR) 50-12.5 MG tablet [Pharmacy Med Name: LOSARTAN/HCTZ 50/12.'5MG'$  TABLETS] 90 tablet 0    Sig: TAKE 1 TABLET BY MOUTH DAILY     Cardiovascular: ARB + Diuretic Combos Failed - 01/06/2023  8:35 AM      Failed - K in normal range and within 180 days    Potassium  Date Value Ref Range Status  06/22/2022 4.5 3.5 - 5.2 mmol/L Final         Failed - Na in normal range and within 180 days    Sodium  Date Value Ref Range Status  06/22/2022 141 134 - 144 mmol/L Final         Failed - Cr in normal range and within 180 days    Creatinine, Ser  Date Value Ref Range Status  06/22/2022 0.82 0.76 - 1.27 mg/dL Final         Failed - eGFR is 10 or above and within 180 days    eGFR  Date Value Ref Range Status  06/22/2022 112 >59 mL/min/1.73 Final         Failed - Valid encounter within last 6 months    Recent Outpatient Visits           6 months ago Medial epicondylitis of right elbow   [place], Donald E, MD   6 months ago Primary hypertension   [place] [name], FNP   10 months ago Primary hypertension   [place] Tally Joe T, FNP   11 months ago Arthralgia, unspecified joint   [facility] [name], [place]              Passed - Patient is not pregnant      Passed - Last BP in normal range    BP Readings from Last 1 Encounters:  07/02/22 119/80

## 2023-02-21 ENCOUNTER — Ambulatory Visit: Payer: 59 | Admitting: Family Medicine

## 2023-02-25 ENCOUNTER — Ambulatory Visit: Payer: 59 | Admitting: Family Medicine

## 2023-02-28 NOTE — Progress Notes (Signed)
I,J'ya E Hunter,acting as a scribe for Jacky Kindle, FNP.,have documented all relevant documentation on the behalf of Jacky Kindle, FNP,as directed by  Jacky Kindle, FNP while in the presence of Jacky Kindle, FNP.  Established patient visit  Patient: Edwin Gordon   DOB: 1976/07/08   47 y.o. Male  MRN: [id number] Visit Date: 03/01/2023  Today's healthcare provider: Jacky Kindle, FNP  Re Introduced to nurse practitioner role and practice setting.  All questions answered.  Discussed provider/patient relationship and expectations.  Onalee Hua [id number] interpretor   Chief Complaint  Patient presents with   Elbow Pain   Subjective    HPI  Elbow Pain: Patient complains of right elbow pain. Onset of the symptoms was about a month ago. Inciting event:  patient reports that when he got blood work during his last visit he felt a sharp point and his elbow. He was hoping that the pain would go away and it never did . Current symptoms include locking, point tenderness , stiffness , and difficulty picking up items . Pain is aggravated by: grasping, lifting heavy objects, supination/pronation as when opening doors. Patient's overall course: gradually worsening. Patient has had no prior elbow problems. Evaluation to date: none. Treatment to date: ice.  Pain level: 9/10  Back Pain: Patient presents for presents evaluation of low back problems.  Symptoms have been present for 1 week and include stiffness in right side of back . Initial inciting event: none. Symptoms are worst: evening. Alleviating factors identifiable by patient are bending backwards, bending sideways, and standing. Exacerbating factors identifiable by patient are recumbency. Treatments so far initiated by patient: none Previous lower back problems: none. Previous workup: none. Previous treatments: none.  He complains that the pain leaves him incapacitated occasionally.  Pain level: 10/10  Patient complains of discomfort in his right  ear. He says he tried to dislodge the particle with a cotton swab. He also complains of severe coughing associated with allergies.   Medications: Outpatient Medications Prior to Visit  Medication Sig   DHA-EPA-Vitamin E (OMEGA-3 COMPLEX PO) Take by mouth.   naproxen (NAPROSYN) 500 MG tablet TAKE 1 TABLET(500 MG) BY MOUTH TWICE DAILY WITH A MEAL   [DISCONTINUED] losartan-hydrochlorothiazide (HYZAAR) 50-12.5 MG tablet TAKE 1 TABLET BY MOUTH DAILY   No facility-administered medications prior to visit.    Review of Systems    Objective    BP 128/86 (BP Location: Left Arm, Patient Position: Sitting, Cuff Size: Normal)   Pulse 82   Temp 98.2 F (36.8 C) (Oral)   Resp 16   Ht 5' 7.32" (1.71 m)   Wt 176 lb 1.6 oz (79.9 kg)   SpO2 99%   BMI 27.32 kg/m   Physical Exam Vitals and nursing note reviewed.  Constitutional:      General: He is awake. He is not in acute distress.    Appearance: Normal appearance. He is well-developed and well-groomed. He is not ill-appearing, toxic-appearing or diaphoretic.  HENT:     Head: Normocephalic and atraumatic.     Jaw: There is normal jaw occlusion. No trismus, tenderness, swelling or pain on movement.     Salivary Glands: Right salivary gland is not diffusely enlarged or tender. Left salivary gland is not diffusely enlarged or tender.     Right Ear: Hearing, tympanic membrane, ear canal and external ear normal. There is no impacted cerumen.     Left Ear: Hearing, tympanic membrane, ear canal and external ear normal.  There is no impacted cerumen.     Ears:     Comments: 2 long hairs noted in r ear    Nose: Nose normal. No congestion or rhinorrhea.     Right Turbinates: Not enlarged, swollen or pale.     Left Turbinates: Not enlarged, swollen or pale.     Right Sinus: No maxillary sinus tenderness or frontal sinus tenderness.     Left Sinus: No maxillary sinus tenderness or frontal sinus tenderness.     Mouth/Throat:     Lips: Pink.     Mouth:  Mucous membranes are moist. No injury, lacerations, oral lesions or angioedema.     Pharynx: Oropharynx is clear. Uvula midline. No pharyngeal swelling, oropharyngeal exudate or posterior oropharyngeal erythema.     Tonsils: No tonsillar exudate or tonsillar abscesses.  Eyes:     General: Lids are normal. Vision grossly intact. Gaze aligned appropriately.        Right eye: No discharge.        Left eye: No discharge.     Extraocular Movements: Extraocular movements intact.     Conjunctiva/sclera: Conjunctivae normal.     Pupils: Pupils are equal, round, and reactive to light.  Neck:     Thyroid: No thyroid mass, thyromegaly or thyroid tenderness.     Vascular: No carotid bruit.     Trachea: Trachea normal. No tracheal tenderness.  Cardiovascular:     Rate and Rhythm: Normal rate and regular rhythm.     Pulses: Normal pulses.          Carotid pulses are 2+ on the right side and 2+ on the left side.      Radial pulses are 2+ on the right side and 2+ on the left side.       Femoral pulses are 2+ on the right side and 2+ on the left side.      Popliteal pulses are 2+ on the right side and 2+ on the left side.       Dorsalis pedis pulses are 2+ on the right side and 2+ on the left side.       Posterior tibial pulses are 2+ on the right side and 2+ on the left side.     Heart sounds: Normal heart sounds, S1 normal and S2 normal. No murmur heard.    No friction rub. No gallop.  Pulmonary:     Effort: Pulmonary effort is normal. No respiratory distress.     Breath sounds: Normal breath sounds and air entry. No stridor. No wheezing, rhonchi or rales.  Chest:     Chest wall: No tenderness.  Abdominal:     General: Abdomen is flat. Bowel sounds are normal. There is no distension.     Palpations: Abdomen is soft. There is no mass.     Tenderness: There is no abdominal tenderness. There is no guarding or rebound.     Hernia: No hernia is present.  Genitourinary:    Comments: Exam deferred;  denies complaints Musculoskeletal:        General: Tenderness present. No swelling, deformity or signs of injury. Normal range of motion.       Arms:     Cervical back: Normal range of motion and neck supple. No rigidity or tenderness.     Right lower leg: No edema.     Left lower leg: No edema.     Comments: R elbow tenderness; negative Phalen/tinel  Mid low back tenderness  Lymphadenopathy:  Cervical: No cervical adenopathy.     Right cervical: No superficial, deep or posterior cervical adenopathy.    Left cervical: No superficial, deep or posterior cervical adenopathy.  Skin:    General: Skin is warm and dry.     Capillary Refill: Capillary refill takes less than 2 seconds.     Coloration: Skin is not jaundiced or pale.     Findings: No bruising, erythema, lesion or rash.  Neurological:     General: No focal deficit present.     Mental Status: He is alert and oriented to person, place, and time. Mental status is at baseline.     GCS: GCS eye subscore is 4. GCS verbal subscore is 5. GCS motor subscore is 6.     Sensory: Sensation is intact. No sensory deficit.     Motor: Motor function is intact. No weakness.     Coordination: Coordination is intact.     Gait: Gait is intact.  Psychiatric:        Attention and Perception: Attention and perception normal.        Mood and Affect: Mood and affect normal.        Speech: Speech normal.        Behavior: Behavior normal. Behavior is cooperative.        Thought Content: Thought content normal.        Cognition and Memory: Cognition normal.        Judgment: Judgment normal.     No results found for any visits on 03/01/23.  Assessment & Plan     Problem List Items Addressed This Visit       Cardiovascular and Mediastinum   Primary hypertension    Chronic, well controlled At goal with known pre-diabetes Repeat labs Cbc and cmp Remains on hyzaar 50-12.5 mg daily       Relevant Medications   losartan-hydrochlorothiazide  (HYZAAR) 50-12.5 MG tablet     Digestive   NAFLD (nonalcoholic fatty liver disease)    Chronic, stable Previously recommend lower fat diet, exercise and substance use changes Patient request for additional CMP today to monitor enzymes Denies concerns       Relevant Orders   Comp. 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, waxes/wanes Denies mechanism of injury Has tried heat; however, has not tried ice Previously received injections by sports med- will refer back for further assessment Recommend use of PRN muscle relaxants to assist with possible ulnar nerve entrapment as well as steroid to assist with inflammation RTC as needed       Relevant Orders   Ambulatory referral to Sports Medicine     Other   Chronic bilateral thoracic back pain    Chronic, stable Denies concern for loss of bowel or bladder or concerns for distal pulse changes or paraesthesias Continue to monitor following use of steroids and prn muscle relaxants  Encourage healthy BMI      Relevant Medications   methocarbamol (ROBAXIN-750) 750 MG tablet   methylPREDNISolone (MEDROL DOSEPAK) 4 MG TBPK tablet   Elevated cholesterol with elevated triglycerides   Relevant Medications   losartan-hydrochlorothiazide (HYZAAR)  50-12.5 MG tablet   Other Relevant Orders   Lipid panel   Elevated glucose    Recommend A1c Continue to recommend balanced, lower carb meals. Smaller meal size, adding snacks. Choosing water as drink of choice and increasing purposeful exercise.       Relevant Orders   Hemoglobin A1c   Non-English speaking patient    Onalee Hua [id number]      Right ear pain    Foreign body noted; patient  tolerated procedure well      Screening PSA (prostate specific antigen)    Denies LUTS; recommend PSA in place of DRE. If PSA is elevated for age, we will repeat; if PSA remains elevated pt will be referred to urology for DRE and next steps for best treatment.        Relevant Orders   PSA   Spanish language interpreter needed    Onalee Hua [phone]      Return if symptoms worsen or fail to improve, for annual examination- schedule as needed .     Leilani Merl, FNP, have reviewed all documentation for this visit. The documentation on 03/01/23 for the exam, diagnosis, procedures, and orders are all accurate and complete.  Jacky Kindle, FNP  [facility] [phone] (phone) [phone] (fax)  [facility]

## 2023-03-01 ENCOUNTER — Ambulatory Visit: Payer: 59 | Admitting: Family Medicine

## 2023-03-01 ENCOUNTER — Other Ambulatory Visit: Payer: Self-pay

## 2023-03-01 ENCOUNTER — Encounter: Payer: Self-pay | Admitting: Family Medicine

## 2023-03-01 ENCOUNTER — Ambulatory Visit (INDEPENDENT_AMBULATORY_CARE_PROVIDER_SITE_OTHER): Payer: Self-pay | Admitting: Family Medicine

## 2023-03-01 VITALS — BP 128/86 | HR 82 | Temp 98.2°F | Resp 16 | Ht 67.32 in | Wt 176.1 lb

## 2023-03-01 DIAGNOSIS — K760 Fatty (change of) liver, not elsewhere classified: Secondary | ICD-10-CM

## 2023-03-01 DIAGNOSIS — T161XXA Foreign body in right ear, initial encounter: Secondary | ICD-10-CM

## 2023-03-01 DIAGNOSIS — I10 Essential (primary) hypertension: Secondary | ICD-10-CM

## 2023-03-01 DIAGNOSIS — Z789 Other specified health status: Secondary | ICD-10-CM

## 2023-03-01 DIAGNOSIS — Z125 Encounter for screening for malignant neoplasm of prostate: Secondary | ICD-10-CM | POA: Insufficient documentation

## 2023-03-01 DIAGNOSIS — Z758 Other problems related to medical facilities and other health care: Secondary | ICD-10-CM | POA: Insufficient documentation

## 2023-03-01 DIAGNOSIS — M7701 Medial epicondylitis, right elbow: Secondary | ICD-10-CM | POA: Insufficient documentation

## 2023-03-01 DIAGNOSIS — M546 Pain in thoracic spine: Secondary | ICD-10-CM

## 2023-03-01 DIAGNOSIS — H9201 Otalgia, right ear: Secondary | ICD-10-CM | POA: Insufficient documentation

## 2023-03-01 DIAGNOSIS — G8929 Other chronic pain: Secondary | ICD-10-CM

## 2023-03-01 DIAGNOSIS — E782 Mixed hyperlipidemia: Secondary | ICD-10-CM

## 2023-03-01 DIAGNOSIS — R7309 Other abnormal glucose: Secondary | ICD-10-CM | POA: Insufficient documentation

## 2023-03-01 MED ORDER — METHOCARBAMOL 750 MG PO TABS: 750.0000 mg | ORAL_TABLET | Freq: Two times a day (BID) | ORAL | 0 refills | Status: AC | PRN

## 2023-03-01 MED ORDER — LOSARTAN POTASSIUM-HCTZ 50-12.5 MG PO TABS
1.0000 | ORAL_TABLET | Freq: Every day | ORAL | 3 refills | Status: AC
Start: 2023-03-01 — End: ?

## 2023-03-01 MED ORDER — METHYLPREDNISOLONE 4 MG PO TBPK: ORAL_TABLET | ORAL | 0 refills | Status: AC

## 2023-03-01 NOTE — Assessment & Plan Note (Signed)
Recommend A1c Continue to recommend balanced, lower carb meals. Smaller meal size, adding snacks. Choosing water as drink of choice and increasing purposeful exercise.  

## 2023-03-01 NOTE — Assessment & Plan Note (Signed)
Chronic, stable Denies concern for loss of bowel or bladder or concerns for distal pulse changes or paraesthesias Continue to monitor following use of steroids and prn muscle relaxants  Encourage healthy BMI

## 2023-03-01 NOTE — Assessment & Plan Note (Signed)
Edwin Gordon [id number]

## 2023-03-01 NOTE — Assessment & Plan Note (Signed)
Chronic, waxes/wanes Denies mechanism of injury Has tried heat; however, has not tried ice Previously received injections by sports med- will refer back for further assessment Recommend use of PRN muscle relaxants to assist with possible ulnar nerve entrapment as well as steroid to assist with inflammation RTC as needed

## 2023-03-01 NOTE — Assessment & Plan Note (Signed)
Chronic, stable Previously recommend lower fat diet, exercise and substance use changes Patient request for additional CMP today to monitor enzymes Denies concerns

## 2023-03-01 NOTE — Assessment & Plan Note (Signed)
Denies LUTS; recommend PSA in place of DRE. If PSA is elevated for age, we will repeat; if PSA remains elevated pt will be referred to urology for DRE and next steps for best treatment.  

## 2023-03-01 NOTE — Assessment & Plan Note (Signed)
Edwin Gordon [id number] 

## 2023-03-01 NOTE — Assessment & Plan Note (Signed)
Chronic, well controlled At goal with known pre-diabetes Repeat labs Cbc and cmp Remains on hyzaar 50-12.5 mg daily

## 2023-03-01 NOTE — Assessment & Plan Note (Signed)
Foreign body noted; patient tolerated procedure well

## 2023-03-01 NOTE — Assessment & Plan Note (Signed)
2 large hairs in R ear following recent haircut Removed with use of irrigation Patient tolerated procedure well

## 2023-03-02 LAB — CBC WITH DIFFERENTIAL/PLATELET
Basophils Absolute: 0 10*3/uL (ref 0.0–0.2)
Basos: 1 %
EOS (ABSOLUTE): 0.1 10*3/uL (ref 0.0–0.4)
Eos: 1 %
Hematocrit: 50 % (ref 37.5–51.0)
Hemoglobin: 16.2 g/dL (ref 13.0–17.7)
Immature Grans (Abs): 0 10*3/uL (ref 0.0–0.1)
Immature Granulocytes: 0 %
Lymphocytes Absolute: 1.4 10*3/uL (ref 0.7–3.1)
Lymphs: 20 %
MCH: 31.2 pg (ref 26.6–33.0)
MCHC: 32.4 g/dL (ref 31.5–35.7)
MCV: 96 fL (ref 79–97)
Monocytes Absolute: 0.4 10*3/uL (ref 0.1–0.9)
Monocytes: 6 %
Neutrophils Absolute: 5 10*3/uL (ref 1.4–7.0)
Neutrophils: 72 %
Platelets: 164 10*3/uL (ref 150–450)
RBC: 5.19 x10E6/uL (ref 4.14–5.80)
RDW: 12.8 % (ref 11.6–15.4)
WBC: 6.9 10*3/uL (ref 3.4–10.8)

## 2023-03-02 LAB — LIPID PANEL
Chol/HDL Ratio: 5 ratio (ref 0.0–5.0)
Cholesterol, Total: 176 mg/dL (ref 100–199)
HDL: 35 mg/dL — ABNORMAL LOW (ref >39)
LDL Chol Calc (NIH): 96 mg/dL (ref 0–99)
Triglycerides: 267 mg/dL — ABNORMAL HIGH (ref 0–149)
VLDL Cholesterol Cal: 45 mg/dL — ABNORMAL HIGH (ref 5–40)

## 2023-03-02 LAB — COMP. METABOLIC PANEL (12)
AST: 24 IU/L (ref 0–40)
Albumin/Globulin Ratio: 1.8 (ref 1.2–2.2)
Albumin: 4.6 g/dL (ref 4.1–5.1)
Alkaline Phosphatase: 92 IU/L (ref 44–121)
BUN/Creatinine Ratio: 11 (ref 9–20)
BUN: 9 mg/dL (ref 6–24)
Bilirubin Total: 0.6 mg/dL (ref 0.0–1.2)
Calcium: 9.5 mg/dL (ref 8.7–10.2)
Chloride: 102 mmol/L (ref 96–106)
Creatinine, Ser: 0.85 mg/dL (ref 0.76–1.27)
Globulin, Total: 2.5 g/dL (ref 1.5–4.5)
Glucose: 103 mg/dL — ABNORMAL HIGH (ref 70–99)
Potassium: 4.4 mmol/L (ref 3.5–5.2)
Sodium: 141 mmol/L (ref 134–144)
Total Protein: 7.1 g/dL (ref 6.0–8.5)
eGFR: 110 mL/min/{1.73_m2} (ref >59)

## 2023-03-02 LAB — PSA: Prostate Specific Ag, Serum: 1.7 ng/mL (ref 0.0–4.0)

## 2023-03-02 LAB — HEMOGLOBIN A1C
Est. average glucose Bld gHb Est-mCnc: 111 mg/dL
Hgb A1c MFr Bld: 5.5 % (ref 4.8–5.6)

## 2023-03-03 NOTE — Progress Notes (Signed)
Cholesterol is improved- reduced bad/LDL cholesterol and total cholesterol. The 10-year ASCVD risk score (Arnett DK, et al., 2019) is: 2.7%   Values used to calculate the score:     Age: 47 years     Sex: Male     Is Non-Hispanic African American: No     Diabetic: No     Tobacco smoker: No     Systolic Blood Pressure: 128 mmHg     Is BP treated: Yes     HDL Cholesterol: 35 mg/dL     Total Cholesterol: 176 mg/dL

## 2023-03-07 NOTE — Progress Notes (Signed)
I, Stevenson Clinch, CMA acting as a scribe for Clementeen Graham, MD.  Edwin Gordon is a 47 y.o. male who presents to [facility] at [facility] today for R elbow pain. 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 He was last seen by Dr. Denyse Amass on 01/26/22 for bilat elbow pain, R>L, and was given a R medial epicondyle steroid injection and was taught HEP.  Today, he c/o R elbow pain ongoing for about 1 month. During a blood draw, he felt a sharp pain in his R elbow and pain never resolved. Pt locates pain to medial aspect. He notes the R elbow will lock, is point tender, stiff, and will have difficulty picking up objections. N/T in 3rd-5th fingers while sleeping with arm overhead or raising arm overhead to shower. Minimal relief with pain meds prescribed 1 week ago. Denies neck pain.   Radiates:no Grip strength: decreased Paresthesia: yes Aggravates: grabbing objects, pronation/supination Treatments tried: ice, heat,  He notes some sensation where his second and third digit will get stuck in a flexed position occasionally.  This typically occurs when he wakes up from sleep in the morning.  It does not happen all the time.  It has been ongoing for months to years.  Pertinent review of systems: No fevers or chills  Relevant historical information: Works as an Personnel officer.  Hypertension.   Exam:  BP 118/80   Pulse 90   Ht 5' 7.3" (1.709 m)   Wt 175 lb 3.2 oz (79.5 kg)   SpO2 100%   BMI 27.20 kg/m  General: Well Developed, well nourished, and in no acute distress.   MSK: Right elbow: Normal-appearing Normal elbow motion. Tender palpation at medial epicondyle. Pain reproduced with resisted finger and wrist flexion and wrist supination.  Pain is located in the medial elbow with these activities. Positive Tinel's at cubital tunnel.  Right hand normal-appearing no triggering able to be reproduced today.  Nontender to palpation.    Lab and Radiology  Results  Procedure: Real-time Ultrasound Guided Injection of common flexor tendon origin at right medial epicondyle Device: Philips Affiniti 50G Images permanently stored and available for review in PACS Verbal informed consent obtained.  Discussed risks and benefits of procedure. Warned about infection, bleeding, hyperglycemia damage to structures among others. Patient expresses understanding and agreement Time-out conducted.   Noted no overlying erythema, induration, or other signs of local infection.   Skin prepped in a sterile fashion.   Local anesthesia: Topical Ethyl chloride.   With sterile technique and under real time ultrasound guidance: 40 mg of Kenalog and 1 mL of lidocaine injected into medial epicondyle common flexor tendon origin. Fluid seen entering the epicondyle.   Completed without difficulty   Pain immediately resolved suggesting accurate placement of the medication.   Advised to call if fevers/chills, erythema, induration, drainage, or persistent bleeding.   Images permanently stored and available for review in the ultrasound unit.  Impression: Technically successful ultrasound guided injection.         Assessment and Plan: 47 y.o. male with right elbow and hand pain.  Symptoms multifactorial.  1) medial epicondylitis.  This is an acute exacerbation of a chronic problem.  He was seen for the same issue over a year ago and did well with a steroid injection.  Plan to repeat injection today as noted above.  Additionally home exercise program taught in clinic today by ATC.  2) hand paresthesia ulnar hand thought to be due to cubital tunnel syndrome.  Plan for cubital tunnel wrist brace at bedtime.  3) possible trigger finger.  This occurs in the right second and third digit.  Plan for double Band-Aid splint.  Recheck in 1 month.   PDMP not reviewed this encounter. Orders Placed This Encounter  Procedures   [place] LIMITED JOINT SPACE STRUCTURES UP RIGHT(NO LINKED  CHARGES)    Order Specific Question:   Reason for Exam (SYMPTOM  OR DIAGNOSIS REQUIRED)    Answer:   right elbow pain    Order Specific Question:   Preferred imaging location?    Answer:   [facility]   No orders of the defined types were placed in this encounter.    Discussed warning signs or symptoms. Please see discharge instructions. Patient expresses understanding.   The above documentation has been reviewed and is accurate and complete Clementeen Graham, M.D.

## 2023-03-08 ENCOUNTER — Ambulatory Visit (INDEPENDENT_AMBULATORY_CARE_PROVIDER_SITE_OTHER): Payer: 59 | Admitting: Family Medicine

## 2023-03-08 ENCOUNTER — Encounter: Payer: Self-pay | Admitting: Family Medicine

## 2023-03-08 ENCOUNTER — Other Ambulatory Visit: Payer: Self-pay

## 2023-03-08 VITALS — BP 118/80 | HR 90 | Ht 67.3 in | Wt 175.2 lb

## 2023-03-08 DIAGNOSIS — M25521 Pain in right elbow: Secondary | ICD-10-CM

## 2023-03-08 DIAGNOSIS — M7701 Medial epicondylitis, right elbow: Secondary | ICD-10-CM

## 2023-03-08 DIAGNOSIS — G5621 Lesion of ulnar nerve, right upper limb: Secondary | ICD-10-CM | POA: Insufficient documentation

## 2023-03-08 DIAGNOSIS — M65321 Trigger finger, right index finger: Secondary | ICD-10-CM

## 2023-03-08 DIAGNOSIS — M6530 Trigger finger, unspecified finger: Secondary | ICD-10-CM | POA: Insufficient documentation

## 2023-03-08 NOTE — Patient Instructions (Addendum)
Thank you for coming in today.   Call or go to the ER if you develop a large red swollen joint with extreme pain or oozing puss.    Cubital Tunnel Syndrome brace.   Please complete the exercises that the athletic trainer went over with you:  View at [email or web address] using code: [id number]  Check back in 1 month

## 2023-03-13 ENCOUNTER — Ambulatory Visit: Payer: Self-pay | Admitting: Family Medicine

## 2023-04-09 NOTE — Progress Notes (Unsigned)
Edwin Payor, PhD, LAT, ATC acting as a scribe for Edwin Graham, MD.  Edwin Gordon is a 47 y.o. male who presents to [facility] at [facility] today for f/u R elbow pain and R 2nd finger triggering. 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 He was last seen by Dr. Denyse Gordon on 03/08/23 and was given a R medial epicondyle steroid injection and was advised to use a double Band-Aid splint for his fingers. Today, pt reports continued pain in the right elbow, though sx are less intense and occurring less often. Some pain when straightening the arm after sleeping. Continues to have n/t in the hand. The 2nd-3rd fingers will lock up sometimes at work, pain with gripping. Denies decreased grip strength. Neck pain has improved.   He notes hand pain and swelling and occasional locking sensation in his fingers.  He is worried his hand will get worse as he ages.  Dx imaging: 01/24/22 R elbow XR  Pertinent review of systems: No fevers or chills  Relevant historical information: Hypertension.   Exam:  BP 128/82   Pulse 81   Ht 5' 7.3" (1.709 m)   Wt 180 lb (81.6 kg)   SpO2 98%   BMI 27.94 kg/m  General: Well Developed, well nourished, and in no acute distress.   MSK: Right hand some swelling across MCPs otherwise normal. Tender palpation especially at third palmar MCP. At the dorsal aspect of his MCP the extensor tendon visibly subluxes with flexion and extension. Intact strength.     Lab and Radiology Results  Procedure: Real-time Ultrasound Guided Injection of right third tendon sheath and A1 pulley (trigger finger injection) Device: Philips Affiniti 50G Images permanently stored and available for review in PACS Verbal informed consent obtained.  Discussed risks and benefits of procedure. Warned about infection, bleeding, hyperglycemia damage to structures among others. Patient expresses understanding and agreement Time-out conducted.   Noted no overlying erythema,  induration, or other signs of local infection.   Skin prepped in a sterile fashion.   Local anesthesia: Topical Ethyl chloride.   With sterile technique and under real time ultrasound guidance: 40 mg of Kenalog and 1 mL of lidocaine injected into A1 pulley at third MCP. Fluid seen entering the tendon sheath.   Completed without difficulty   Pain immediately resolved suggesting accurate placement of the medication.   Advised to call if fevers/chills, erythema, induration, drainage, or persistent bleeding.   Images permanently stored and available for review in the ultrasound unit.  Impression: Technically successful ultrasound guided injection.  Quick look at the ultrasound prior to injection does show slightly enlarged median nerve at carpal tunnel measuring 16 mm cross-sectional area.  X-ray images right hand obtained today personally and independently interpreted No severe osteoarthritis.  No erosions concerning for rheumatoid arthritis. Await formal radiology review     Assessment and Plan: 47 y.o. male with multifactorial right hand pain.  Today I think a fair amount of his symptoms are trigger finger.  He does have what looks to be a dorsal tendon subluxation on the third digit as well.  He has some hand swelling and may have risk for rheumatoid process.  Reevaluate for inflammatory rheumatology labs listed below.   PDMP not reviewed this encounter. Orders Placed This Encounter  Procedures   [place] LIMITED JOINT SPACE STRUCTURES UP RIGHT(NO LINKED CHARGES)    Order Specific Question:   Reason for Exam (SYMPTOM  OR DIAGNOSIS REQUIRED)    Answer:  right elbow pain    Order Specific Question:   Preferred imaging location?    Answer:   [facility] Hand Complete Right    Standing Status:   Future    Number of Occurrences:   1    Standing Expiration Date:   05/10/2023    Order Specific Question:   Reason for Exam (SYMPTOM  OR DIAGNOSIS REQUIRED)     Answer:   right hand pain    Order Specific Question:   Preferred imaging location?    Answer:   [facility]   Sedimentation rate    Standing Status:   Future    Number of Occurrences:   1    Standing Expiration Date:   04/09/2024   Rheumatoid factor    Standing Status:   Future    Number of Occurrences:   1    Standing Expiration Date:   04/09/2024   Cyclic citrul peptide antibody, IgG    Standing Status:   Future    Number of Occurrences:   1    Standing Expiration Date:   04/09/2024   ANA    Standing Status:   Future    Number of Occurrences:   1    Standing Expiration Date:   04/09/2024   No orders of the defined types were placed in this encounter.    Discussed warning signs or symptoms. Please see discharge instructions. Patient expresses understanding.   The above documentation has been reviewed and is accurate and complete Edwin Gordon, M.D.

## 2023-04-10 ENCOUNTER — Ambulatory Visit: Payer: 59 | Admitting: Family Medicine

## 2023-04-10 ENCOUNTER — Encounter: Payer: Self-pay | Admitting: Family Medicine

## 2023-04-10 ENCOUNTER — Other Ambulatory Visit: Payer: Self-pay

## 2023-04-10 ENCOUNTER — Ambulatory Visit (INDEPENDENT_AMBULATORY_CARE_PROVIDER_SITE_OTHER): Payer: 59

## 2023-04-10 VITALS — BP 128/82 | HR 81 | Ht 67.3 in | Wt 180.0 lb

## 2023-04-10 DIAGNOSIS — M79641 Pain in right hand: Secondary | ICD-10-CM

## 2023-04-10 DIAGNOSIS — M25541 Pain in joints of right hand: Secondary | ICD-10-CM | POA: Diagnosis not present

## 2023-04-10 DIAGNOSIS — M25521 Pain in right elbow: Secondary | ICD-10-CM | POA: Diagnosis not present

## 2023-04-10 DIAGNOSIS — M65321 Trigger finger, right index finger: Secondary | ICD-10-CM

## 2023-04-10 DIAGNOSIS — M25542 Pain in joints of left hand: Secondary | ICD-10-CM

## 2023-04-10 LAB — SEDIMENTATION RATE: Sed Rate: 5 mm/hr (ref 0–15)

## 2023-04-10 NOTE — Patient Instructions (Addendum)
Thank you for coming in today.   You received an injection today. Seek immediate medical attention if the joint becomes red, extremely painful, or is oozing fluid.   Please get labs today before you leave   Please get an Xray today before you leave    Please get labs today before you leave

## 2023-04-12 LAB — ANA: Anti Nuclear Antibody (ANA): NEGATIVE

## 2023-04-12 LAB — CYCLIC CITRUL PEPTIDE ANTIBODY, IGG: Cyclic Citrullin Peptide Ab: <16 UNITS

## 2023-04-12 LAB — RHEUMATOID FACTOR: Rheumatoid fact SerPl-aCnc: <10 IU/mL (ref <14)

## 2023-04-15 NOTE — Progress Notes (Signed)
Hand x-ray looks normal to radiology.

## 2023-04-15 NOTE — Progress Notes (Signed)
Labs to look for rheumatology problems are normal.

## 2023-04-19 ENCOUNTER — Telehealth: Payer: Self-pay

## 2023-04-19 MED ORDER — MELOXICAM 15 MG PO TABS: ORAL_TABLET | ORAL | 3 refills | Status: AC

## 2023-04-19 NOTE — Telephone Encounter (Signed)
I prescribed meloxicam.  He can take it daily.  It may help.

## 2023-04-19 NOTE — Telephone Encounter (Signed)
Called pt to go over lab results and DI results.   Pt advised that he is still having hand pain. Has tried OTC meds with no relief. Requesting that something be called in for inflammation or to help his muscles relax.   [facility]  Forwarding to Dr. Denyse Amass.

## 2023-04-19 NOTE — Addendum Note (Signed)
Addended by: Rodolph Bong on: 04/19/2023 12:06 PM   Modules accepted: Orders

## 2023-08-23 ENCOUNTER — Ambulatory Visit (INDEPENDENT_AMBULATORY_CARE_PROVIDER_SITE_OTHER): Payer: 59 | Admitting: Family Medicine

## 2023-08-23 ENCOUNTER — Encounter: Payer: Self-pay | Admitting: Family Medicine

## 2023-08-23 VITALS — BP 126/83 | HR 77 | Temp 98.3°F | Ht 67.3 in | Wt 180.2 lb

## 2023-08-23 DIAGNOSIS — R7309 Other abnormal glucose: Secondary | ICD-10-CM

## 2023-08-23 DIAGNOSIS — K2100 Gastro-esophageal reflux disease with esophagitis, without bleeding: Secondary | ICD-10-CM

## 2023-08-23 DIAGNOSIS — Z125 Encounter for screening for malignant neoplasm of prostate: Secondary | ICD-10-CM

## 2023-08-23 DIAGNOSIS — E782 Mixed hyperlipidemia: Secondary | ICD-10-CM

## 2023-08-23 DIAGNOSIS — Z0000 Encounter for general adult medical examination without abnormal findings: Secondary | ICD-10-CM | POA: Diagnosis not present

## 2023-08-23 DIAGNOSIS — I10 Essential (primary) hypertension: Secondary | ICD-10-CM

## 2023-08-23 DIAGNOSIS — R09A2 Foreign body sensation, throat: Secondary | ICD-10-CM

## 2023-08-23 DIAGNOSIS — Z758 Other problems related to medical facilities and other health care: Secondary | ICD-10-CM

## 2023-08-23 DIAGNOSIS — Z2821 Immunization not carried out because of patient refusal: Secondary | ICD-10-CM

## 2023-08-23 DIAGNOSIS — Z1211 Encounter for screening for malignant neoplasm of colon: Secondary | ICD-10-CM | POA: Insufficient documentation

## 2023-08-23 MED ORDER — PANTOPRAZOLE SODIUM 20 MG PO TBEC
20.0000 mg | DELAYED_RELEASE_TABLET | Freq: Two times a day (BID) | ORAL | 3 refills | Status: AC
Start: 2023-08-23 — End: ?

## 2023-08-23 NOTE — Patient Instructions (Signed)
[id number]  reset

## 2023-08-23 NOTE — Progress Notes (Signed)
Interpretor: Gracy Racer #[id number]  Complete physical exam  Patient: Edwin Gordon   DOB: [birth year]   47 y.o. Male  MRN: [id number] Visit Date: 08/23/2023  Today's healthcare provider: Jacky Kindle, FNP  Re-introduced to nurse practitioner role and practice setting.  All questions answered.  Discussed provider/patient relationship and expectations.  Chief Complaint  Patient presents with   Annual Exam    Follow up on fatty liver, would like blood work   Subjective    Edwin Gordon is a 47 y.o. male who presents today for a complete physical exam.  He reports consuming a general diet. The patient has a physically strenuous job, but has no regular exercise apart from work.  He generally feels fairly well. He reports sleeping fairly well. He does have additional problems to discuss today.   HPI HPI     Annual Exam    Additional comments: Follow up on fatty liver, would like blood work      Last edited by Rolly Salter, CMA on 08/23/2023  9:02 AM.     The patient presents for an annual physical and reports a sensation of 'something in my throat' that feels like dust, causing them to cough and clear their throat. They deny associated symptoms of congestion, blocked ears, or itchy, watery eyes. They also deny difficulty swallowing or changes in their diet due to swallowing issues. However, they note that after eating, they feel as if they have phlegm in their throat, which they attempt to expel but are unable to. They report no relief from coughing or attempting to spit up. They have no other concerns or symptoms at this time.  Past Medical History:  Diagnosis Date   Arthritis    Past Surgical History:  Procedure Laterality Date   NO PAST SURGERIES     Social History   Socioeconomic History   Marital status: Married    Spouse name: Not on file   Number of children: Not on file   Years of education: Not on file   Highest education level: Not on file  Occupational History   Not  on file  Tobacco Use   Smoking status: Never   Smokeless tobacco: Never  Substance and Sexual Activity   Alcohol use: Yes    Alcohol/week: 6.0 standard drinks of alcohol    Types: 6 Cans of beer per week   Drug use: Never   Sexual activity: Not on file  Other Topics Concern   Not on file  Social History Narrative   Not on file   Social Determinants of Health   Financial Resource Strain: Not on file  Food Insecurity: Not on file  Transportation Needs: Not on file  Physical Activity: Not on file  Stress: Not on file  Social Connections: Not on file  Intimate Partner Violence: Not on file   No family status information on file.   No family history on file. No Known Allergies  Patient Care Team: Jacky Kindle, FNP as PCP - General (Family Medicine)   Medications: Outpatient Medications Prior to Visit  Medication Sig   DHA-EPA-Vitamin E (OMEGA-3 COMPLEX PO) Take by mouth.   losartan-hydrochlorothiazide (HYZAAR) 50-12.5 MG tablet Take 1 tablet by mouth daily.   meloxicam (MOBIC) 15 MG tablet One tab PO qAM with breakfast for 2 weeks, then daily prn pain.   methocarbamol (ROBAXIN-750) 750 MG tablet Take 1 tablet (750 mg total) by mouth 2 (two) times daily as needed for muscle spasms.   methylPREDNISolone (  MEDROL DOSEPAK) 4 MG TBPK tablet Take with food.   No facility-administered medications prior to visit.   Review of Systems Last CBC Lab Results  Component Value Date   WBC 6.9 03/01/2023   HGB 16.2 03/01/2023   HCT 50.0 03/01/2023   MCV 96 03/01/2023   MCH 31.2 03/01/2023   RDW 12.8 03/01/2023   PLT 164 03/01/2023   Last metabolic panel Lab Results  Component Value Date   GLUCOSE 103 (H) 03/01/2023   NA 141 03/01/2023   K 4.4 03/01/2023   CL 102 03/01/2023   CO2 21 06/22/2022   BUN 9 03/01/2023   CREATININE 0.85 03/01/2023   EGFR 110 03/01/2023   CALCIUM 9.5 03/01/2023   PROT 7.1 03/01/2023   ALBUMIN 4.6 03/01/2023   LABGLOB 2.5 03/01/2023   AGRATIO  1.8 03/01/2023   BILITOT 0.6 03/01/2023   ALKPHOS 92 03/01/2023   AST 24 03/01/2023   ALT 78 (H) 06/22/2022   Last lipids Lab Results  Component Value Date   CHOL 176 03/01/2023   HDL 35 (L) 03/01/2023   LDLCALC 96 03/01/2023   TRIG 267 (H) 03/01/2023   CHOLHDL 5.0 03/01/2023   Last hemoglobin A1c Lab Results  Component Value Date   HGBA1C 5.5 03/01/2023   Last thyroid functions Lab Results  Component Value Date   TSH 1.260 01/24/2022     Objective    BP 126/83 (BP Location: Left Arm, Patient Position: Sitting, Cuff Size: Large)   Pulse 77   Temp 98.3 F (36.8 C) (Oral)   Ht 5' 7.3" (1.709 m)   Wt 180 lb 3.2 oz (81.7 kg)   SpO2 99%   BMI 27.97 kg/m   BP Readings from Last 3 Encounters:  08/23/23 126/83  04/10/23 128/82  03/08/23 118/80   Wt Readings from Last 3 Encounters:  08/23/23 180 lb 3.2 oz (81.7 kg)  04/10/23 180 lb (81.6 kg)  03/08/23 175 lb 3.2 oz (79.5 kg)   SpO2 Readings from Last 3 Encounters:  08/23/23 99%  04/10/23 98%  03/08/23 100%   Physical Exam Vitals and nursing note reviewed.  Constitutional:      General: He is awake. He is not in acute distress.    Appearance: Normal appearance. He is well-developed, well-groomed and overweight. He is not ill-appearing, toxic-appearing or diaphoretic.  HENT:     Head: Normocephalic and atraumatic.     Jaw: There is normal jaw occlusion. No trismus, tenderness, swelling or pain on movement.     Salivary Glands: Right salivary gland is not diffusely enlarged or tender. Left salivary gland is not diffusely enlarged or tender.     Right Ear: Hearing, tympanic membrane, ear canal and external ear normal. There is no impacted cerumen.     Left Ear: Hearing, tympanic membrane, ear canal and external ear normal. There is no impacted cerumen.     Nose: Nose normal. No congestion or rhinorrhea.     Right Turbinates: Not enlarged, swollen or pale.     Left Turbinates: Not enlarged, swollen or pale.      Right Sinus: No maxillary sinus tenderness or frontal sinus tenderness.     Left Sinus: No maxillary sinus tenderness or frontal sinus tenderness.     Mouth/Throat:     Lips: Pink.     Mouth: Mucous membranes are moist. No injury, lacerations, oral lesions or angioedema.     Pharynx: Oropharynx is clear. Uvula midline. No pharyngeal swelling, oropharyngeal exudate or posterior oropharyngeal erythema.  Tonsils: No tonsillar exudate or tonsillar abscesses.  Eyes:     General: Lids are normal. Vision grossly intact. Gaze aligned appropriately.        Right eye: No discharge.        Left eye: No discharge.     Extraocular Movements: Extraocular movements intact.     Conjunctiva/sclera: Conjunctivae normal.     Pupils: Pupils are equal, round, and reactive to light.  Neck:     Thyroid: No thyroid mass, thyromegaly or thyroid tenderness.     Vascular: No carotid bruit.     Trachea: Trachea normal. No tracheal tenderness.  Cardiovascular:     Rate and Rhythm: Normal rate and regular rhythm.     Pulses: Normal pulses.          Carotid pulses are 2+ on the right side and 2+ on the left side.      Radial pulses are 2+ on the right side and 2+ on the left side.       Femoral pulses are 2+ on the right side and 2+ on the left side.      Popliteal pulses are 2+ on the right side and 2+ on the left side.       Dorsalis pedis pulses are 2+ on the right side and 2+ on the left side.       Posterior tibial pulses are 2+ on the right side and 2+ on the left side.     Heart sounds: Normal heart sounds, S1 normal and S2 normal. No murmur heard.    No friction rub. No gallop.  Pulmonary:     Effort: Pulmonary effort is normal. No respiratory distress.     Breath sounds: Normal breath sounds and air entry. No stridor. No wheezing, rhonchi or rales.  Chest:     Chest wall: No tenderness.  Abdominal:     General: Abdomen is flat. Bowel sounds are normal. There is no distension.     Palpations: Abdomen  is soft. There is no mass.     Tenderness: There is no abdominal tenderness. There is no guarding or rebound.     Hernia: No hernia is present.  Genitourinary:    Comments: Exam deferred; denies complaints Musculoskeletal:        General: No swelling, tenderness, deformity or signs of injury. Normal range of motion.     Cervical back: Normal range of motion and neck supple. No rigidity or tenderness.     Right lower leg: No edema.     Left lower leg: No edema.  Lymphadenopathy:     Cervical: No cervical adenopathy.     Right cervical: No superficial, deep or posterior cervical adenopathy.    Left cervical: No superficial, deep or posterior cervical adenopathy.  Skin:    General: Skin is warm and dry.     Capillary Refill: Capillary refill takes less than 2 seconds.     Coloration: Skin is not jaundiced or pale.     Findings: No bruising, erythema, lesion or rash.  Neurological:     General: No focal deficit present.     Mental Status: He is alert and oriented to person, place, and time. Mental status is at baseline.     GCS: GCS eye subscore is 4. GCS verbal subscore is 5. GCS motor subscore is 6.     Sensory: Sensation is intact. No sensory deficit.     Motor: Motor function is intact. No weakness.     Coordination:  Coordination is intact.     Gait: Gait is intact.  Psychiatric:        Attention and Perception: Attention and perception normal.        Mood and Affect: Mood and affect normal.        Speech: Speech normal.        Behavior: Behavior normal. Behavior is cooperative.        Thought Content: Thought content normal.        Cognition and Memory: Cognition normal.        Judgment: Judgment normal.     Last depression screening scores    03/01/2023    8:32 AM 02/23/2022    8:14 AM 01/24/2022    9:37 AM  PHQ 2/9 Scores  PHQ - 2 Score 0 0 0  PHQ- 9 Score 1 0 0   Last fall risk screening    03/01/2023    8:32 AM  Fall Risk   Falls in the past year? 0  Number falls  in past yr: 0  Injury with Fall? 0  Risk for fall due to : No Fall Risks   Last Audit-C alcohol use screening    03/01/2023    8:35 AM  Alcohol Use Disorder Test (AUDIT)  1. How often do you have a drink containing alcohol? 4  2. How many drinks containing alcohol do you have on a typical day when you are drinking? 0  3. How often do you have six or more drinks on one occasion? 0  AUDIT-C Score 4   A score of 3 or more in women, and 4 or more in men indicates increased risk for alcohol abuse, EXCEPT if all of the points are from question 1   No results found for any visits on 08/23/23.  Assessment & Plan    Routine Health Maintenance and Physical Exam  Exercise Activities and Dietary recommendations  Goals   None     Immunization History  Administered Date(s) Administered   PFIZER(Purple Top)SARS-COV-2 Vaccination 01/30/2020, 02/20/2020    Health Maintenance  Topic Date Due   DTaP/Tdap/Td (1 - Tdap) Never done   INFLUENZA VACCINE  Never done   COVID-19 Vaccine (3 - 2023-24 season) 07/07/2023   Colonoscopy  Never done   Hepatitis C Screening  Completed   HIV Screening  Completed   HPV VACCINES  Aged Out    Discussed health benefits of physical activity, and encouraged him to engage in regular exercise appropriate for his age and condition.  Problem List Items Addressed This Visit       Cardiovascular and Mediastinum   Primary hypertension     Other   Annual physical exam - Primary   Relevant Orders   Comprehensive Metabolic Panel (CMET)   Lipid panel   CBC with Differential/Platelet   TSH   Elevated cholesterol with elevated triglycerides   Relevant Orders   Lipid panel   Elevated glucose   Relevant Orders   Hemoglobin A1c   Screening PSA (prostate specific antigen)   Relevant Orders   PSA   Spanish language interpreter needed   Other Visit Diagnoses     Gastroesophageal reflux disease with esophagitis without hemorrhage       Relevant Medications    pantoprazole (PROTONIX) 20 MG tablet   Screen for colon cancer       Relevant Orders   Ambulatory referral to Gastroenterology   Influenza vaccination declined         Throat discomfort  Reports a sensation of dust in the throat leading to coughing and voice changes. No associated symptoms of allergies or gastroesophageal reflux disease. -Start a low-dose medication for stomach acid to see if symptoms improve. -Refer to gastroenterology for possible esophageal evaluation with endoscopy during upcoming colon cancer screening.  Colon Cancer Screening Patient is of age for colon cancer screening. -Schedule colonoscopy with gastroenterology.  General Health Maintenance -Complete blood work today including cell count, liver enzymes, cholesterol, thyroid, prostate labs, and blood sugar average. -Follow-up appointment in a month to assess response to stomach acid medication.    Leilani Merl, FNP, have reviewed all documentation for this visit. The documentation on 08/23/23 for the exam, diagnosis, procedures, and orders are all accurate and complete.  Jacky Kindle, FNP  [facility] [phone] (phone) [phone] (fax)  [facility]

## 2023-08-24 LAB — CBC WITH DIFFERENTIAL/PLATELET
Basophils Absolute: 0 10*3/uL (ref 0.0–0.2)
Basos: 1 %
EOS (ABSOLUTE): 0.1 10*3/uL (ref 0.0–0.4)
Eos: 1 %
Hematocrit: 47.7 % (ref 37.5–51.0)
Hemoglobin: 16 g/dL (ref 13.0–17.7)
Immature Grans (Abs): 0 10*3/uL (ref 0.0–0.1)
Immature Granulocytes: 0 %
Lymphocytes Absolute: 2.4 10*3/uL (ref 0.7–3.1)
Lymphs: 34 %
MCH: 32.1 pg (ref 26.6–33.0)
MCHC: 33.5 g/dL (ref 31.5–35.7)
MCV: 96 fL (ref 79–97)
Monocytes Absolute: 0.5 10*3/uL (ref 0.1–0.9)
Monocytes: 8 %
Neutrophils Absolute: 4 10*3/uL (ref 1.4–7.0)
Neutrophils: 56 %
Platelets: 182 10*3/uL (ref 150–450)
RBC: 4.98 x10E6/uL (ref 4.14–5.80)
RDW: 12.5 % (ref 11.6–15.4)
WBC: 7 10*3/uL (ref 3.4–10.8)

## 2023-08-24 LAB — COMPREHENSIVE METABOLIC PANEL
ALT: 47 [IU]/L — ABNORMAL HIGH (ref 0–44)
AST: 28 [IU]/L (ref 0–40)
Albumin: 4.5 g/dL (ref 4.1–5.1)
Alkaline Phosphatase: 92 [IU]/L (ref 44–121)
BUN/Creatinine Ratio: 14 (ref 9–20)
BUN: 11 mg/dL (ref 6–24)
Bilirubin Total: 0.7 mg/dL (ref 0.0–1.2)
CO2: 23 mmol/L (ref 20–29)
Calcium: 9.6 mg/dL (ref 8.7–10.2)
Chloride: 103 mmol/L (ref 96–106)
Creatinine, Ser: 0.79 mg/dL (ref 0.76–1.27)
Globulin, Total: 2.5 g/dL (ref 1.5–4.5)
Glucose: 98 mg/dL (ref 70–99)
Potassium: 4 mmol/L (ref 3.5–5.2)
Sodium: 142 mmol/L (ref 134–144)
Total Protein: 7 g/dL (ref 6.0–8.5)
eGFR: 112 mL/min/{1.73_m2} (ref >59)

## 2023-08-24 LAB — HEMOGLOBIN A1C
Est. average glucose Bld gHb Est-mCnc: 111 mg/dL
Hgb A1c MFr Bld: 5.5 % (ref 4.8–5.6)

## 2023-08-24 LAB — LIPID PANEL
Chol/HDL Ratio: 4.8 {ratio} (ref 0.0–5.0)
Cholesterol, Total: 203 mg/dL — ABNORMAL HIGH (ref 100–199)
HDL: 42 mg/dL (ref >39)
LDL Chol Calc (NIH): 116 mg/dL — ABNORMAL HIGH (ref 0–99)
Triglycerides: 260 mg/dL — ABNORMAL HIGH (ref 0–149)
VLDL Cholesterol Cal: 45 mg/dL — ABNORMAL HIGH (ref 5–40)

## 2023-08-24 LAB — PSA: Prostate Specific Ag, Serum: 0.8 ng/mL (ref 0.0–4.0)

## 2023-08-24 LAB — TSH: TSH: 1.81 u[IU]/mL (ref 0.450–4.500)

## 2023-08-25 IMAGING — US US ABDOMEN LIMITED
1 series · 14 of 25 positions shown · non-contrast
Comparison: None.

CLINICAL DATA: Elevated LFTs

EXAM:
ULTRASOUND ABDOMEN LIMITED RIGHT UPPER QUADRANT

[Series 1: us abdomen limited ruq (liver/gb) · 14 of 105 slices shown]
[im 1/105]
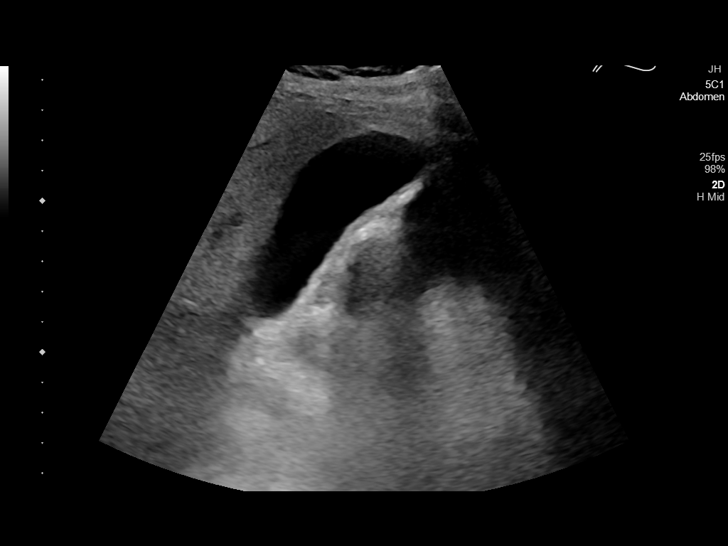
[im 9/105]
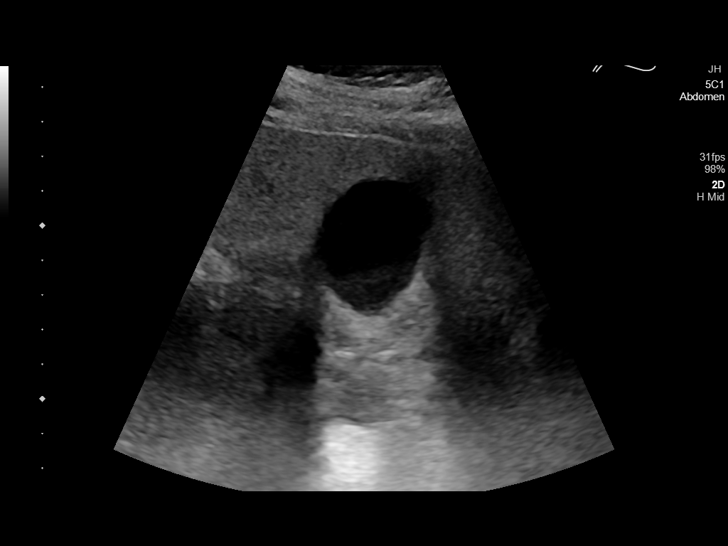
[im 18/105]
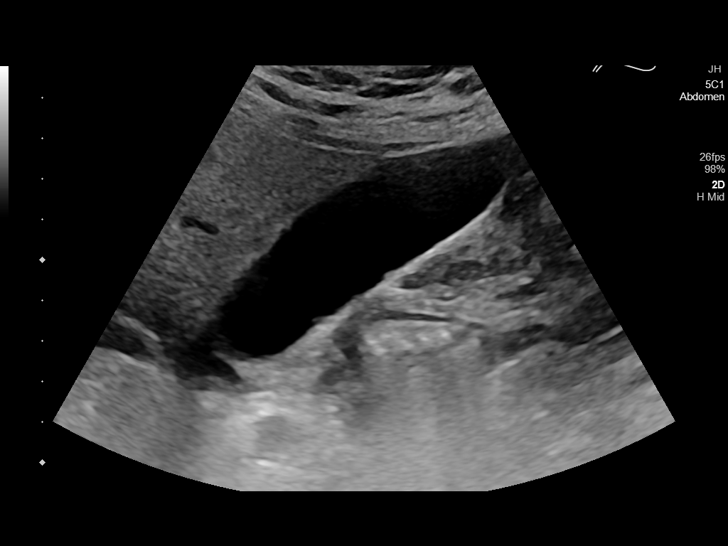
[im 27/105]
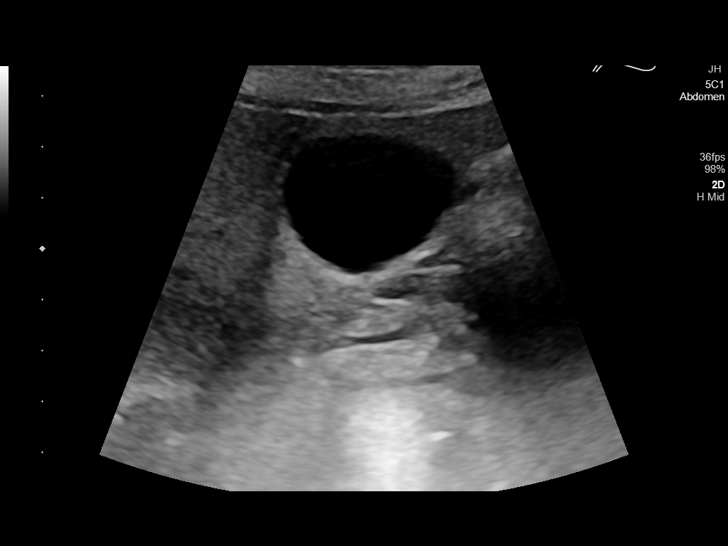
[im 35/105]
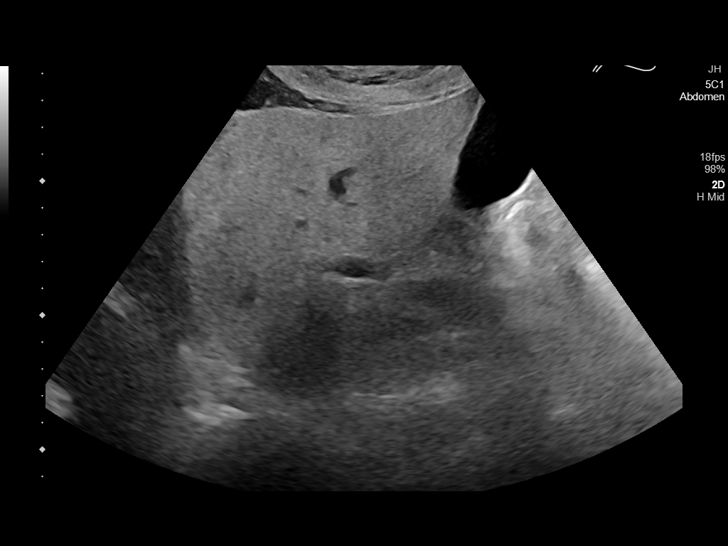
[im 40/105]
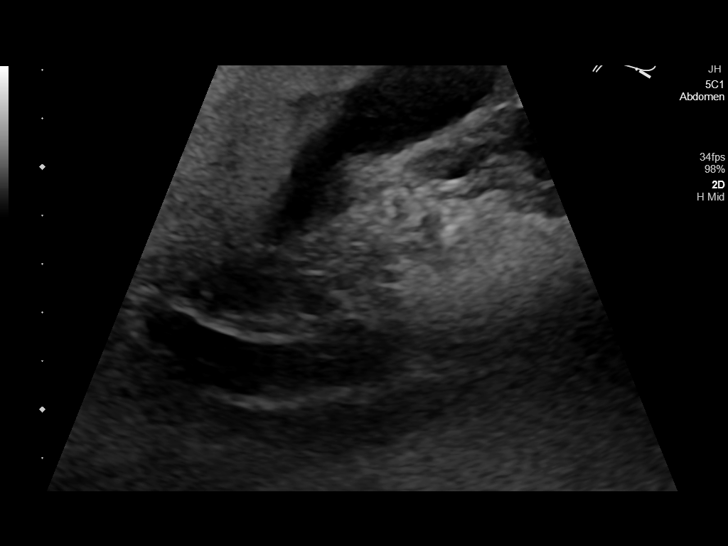
[im 48/105]
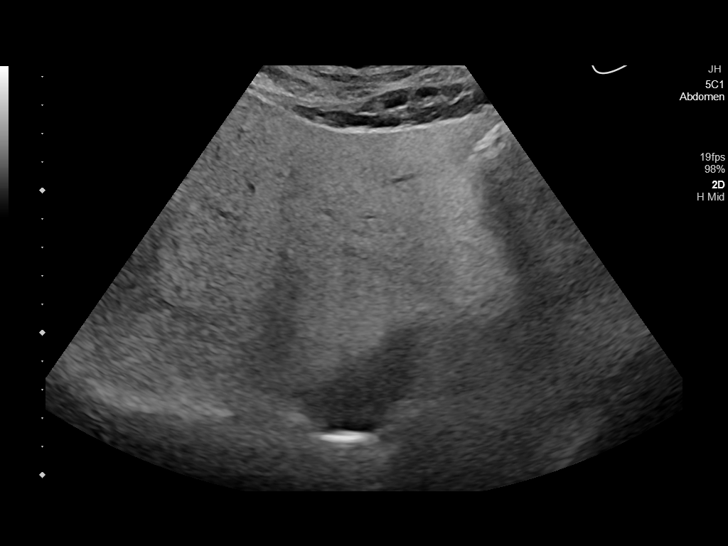
[im 57/105]
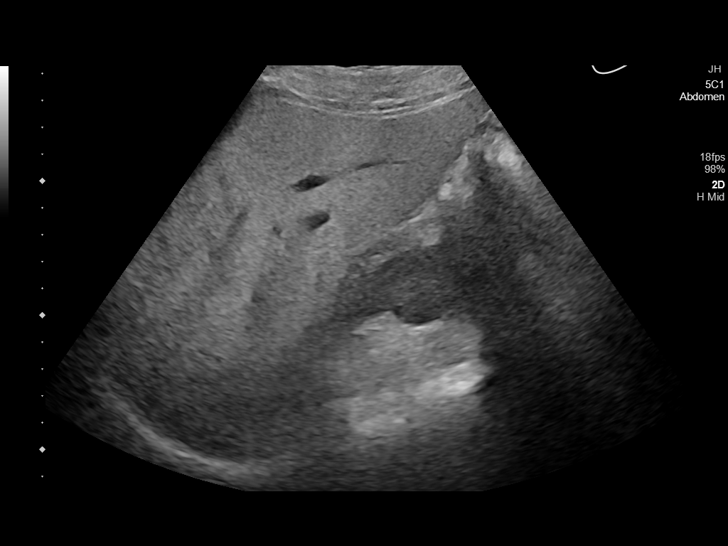
[im 66/105]
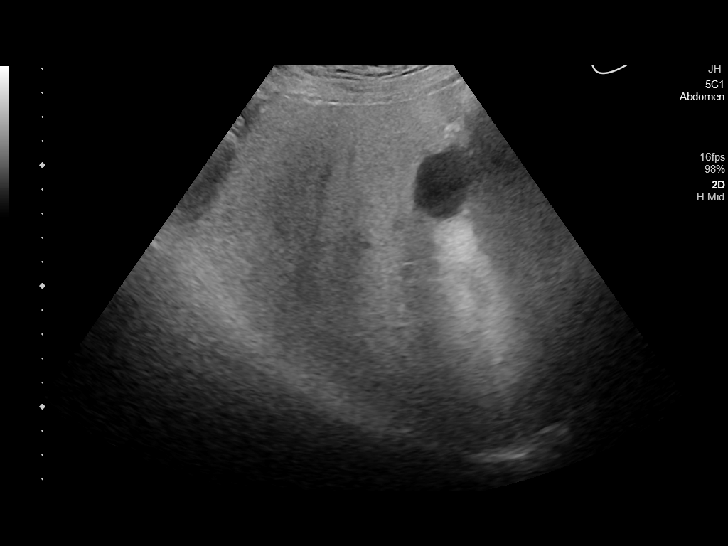
[im 70/105]
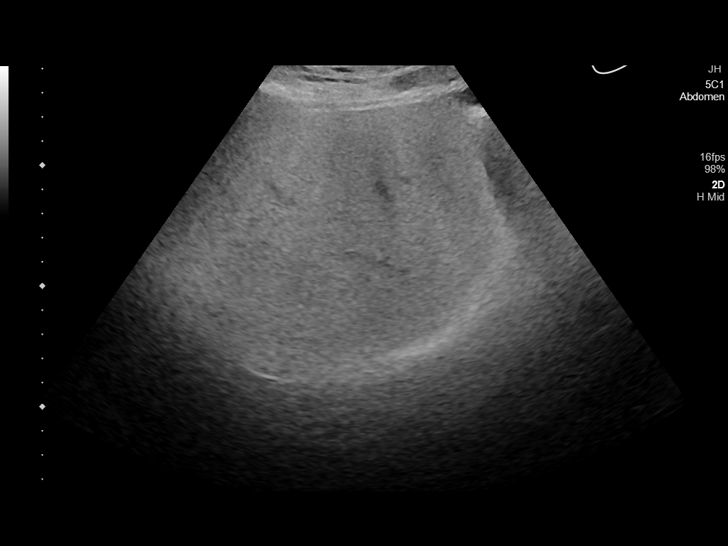
[im 79/105]
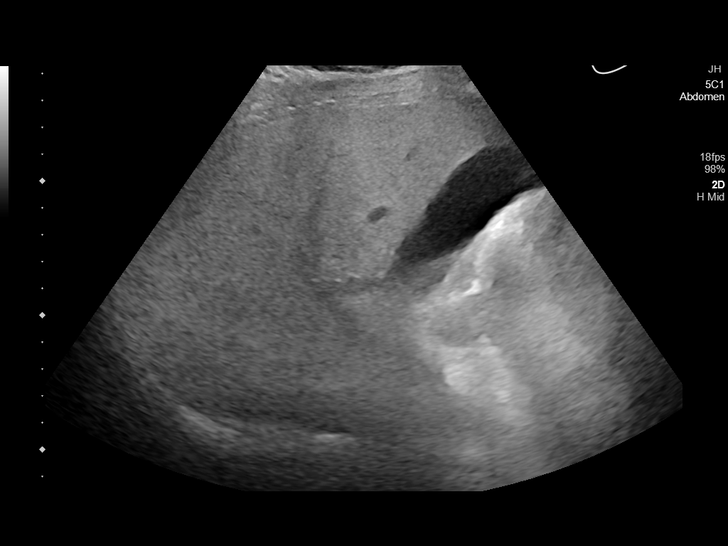
[im 87/105]
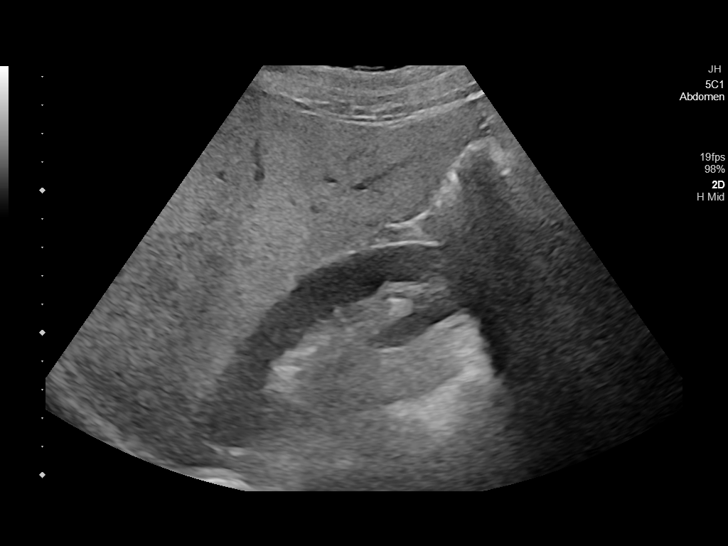
[im 96/105]
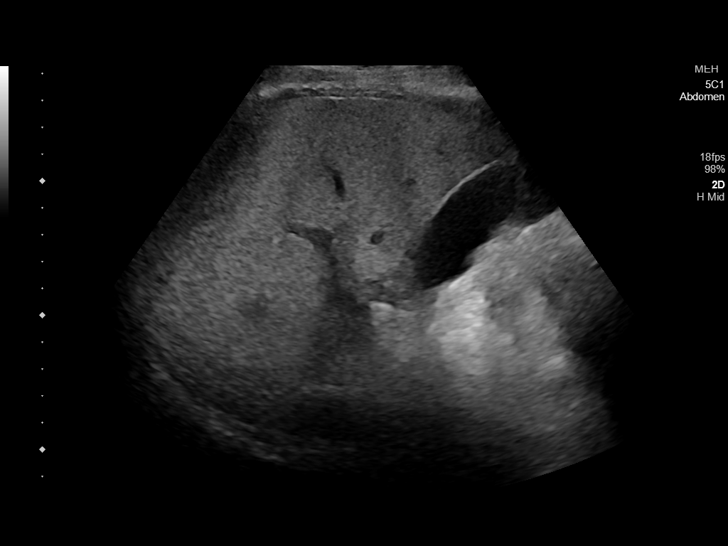
[im 105/105]
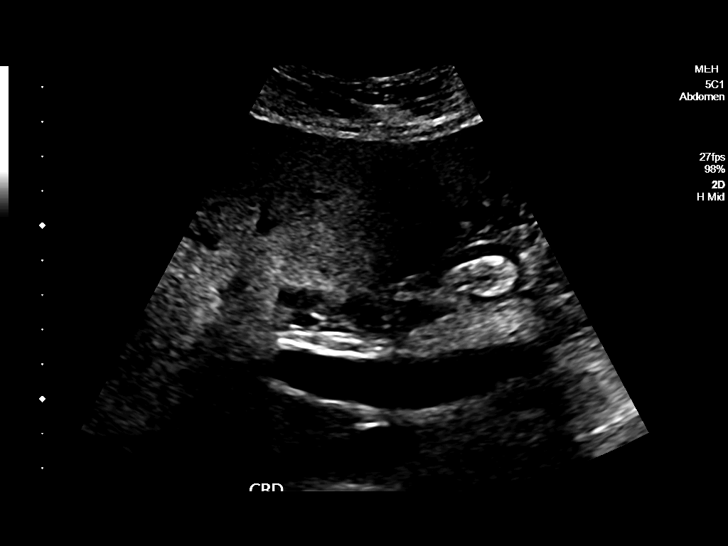

[14 of 25 positions shown; findings below may reference images not displayed]

FINDINGS: Gallbladder:

No gallstones or wall thickening visualized. No sonographic Murphy
sign noted by sonographer.

Common bile duct:

Diameter: 2 mm

Liver:

Diffuse increased echogenicity of the liver parenchyma with a small
area of sparing noted adjacent to the gallbladder. No focal hepatic
mass identified. Portal vein is patent on color Doppler imaging with
normal direction of blood flow towards the liver.

Other: None.
IMPRESSION: Evidence of hepatic steatosis.

## 2023-09-20 ENCOUNTER — Ambulatory Visit: Payer: Self-pay | Admitting: Family Medicine
